# Patient Record
Sex: Male | Born: 1955 | Race: White | Hispanic: No | Marital: Married | Smoking: Former smoker
Health system: Southern US, Community
[De-identification: ages and names within clinical notes are randomized; demographics above are authoritative.]

---

## 2003-07-26 ENCOUNTER — Encounter: Admission: RE | Admit: 2003-07-26 | Discharge: 2003-07-26 | Payer: Self-pay | Admitting: Orthopedic Surgery

## 2005-01-01 ENCOUNTER — Inpatient Hospital Stay (HOSPITAL_COMMUNITY): Admission: RE | Admit: 2005-01-01 | Discharge: 2005-01-04 | Payer: Self-pay | Admitting: Orthopedic Surgery

## 2005-01-23 ENCOUNTER — Inpatient Hospital Stay (HOSPITAL_COMMUNITY): Admission: EM | Admit: 2005-01-23 | Discharge: 2005-01-31 | Payer: Self-pay | Admitting: Emergency Medicine

## 2005-04-22 ENCOUNTER — Encounter: Admission: RE | Admit: 2005-04-22 | Discharge: 2005-04-22 | Payer: Self-pay | Admitting: General Surgery

## 2006-11-11 IMAGING — CT CT ABDOMEN W/ CM
1 of 5 series · 14 of 36 positions shown, 19 images · IV contrast (ORAL OMNI 350 & 100 ML OMNI 300)
Comparison: 01/23/05.

CLINICAL DATA: Contained bowel perforation at the juncture of the distal duodenum and proximal jejunum.  Follow-up performed.
ABDOMEN CT WITH CONTRAST:
TECHNIQUE: Multidetector CT imaging of the abdomen was performed following the standard protocol during bolus administration of intravenous contrast.
Contrast:  100 cc Omnipaque 300 IV as well as oral contrast.
TECHNIQUE: Multidetector CT imaging of the pelvis was performed following the standard protocol during bolus administration of intravenous contrast.

[Series 102: routine abdomen · axial · 0.86mm/px · z∈[-522,-72]mm · 14 of 221 slices shown, 19 images]
[im 12/221  soft-tissue]
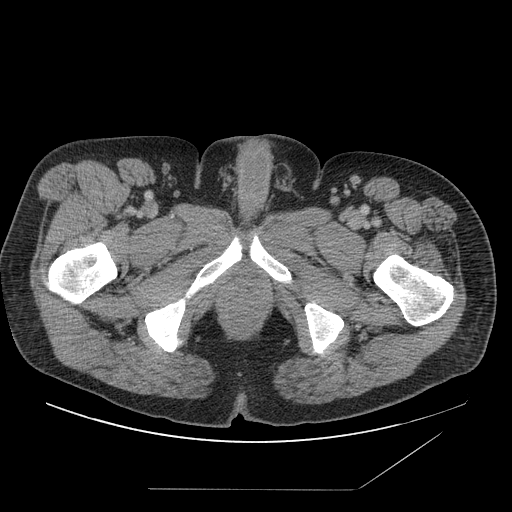
[im 12/221  bone]
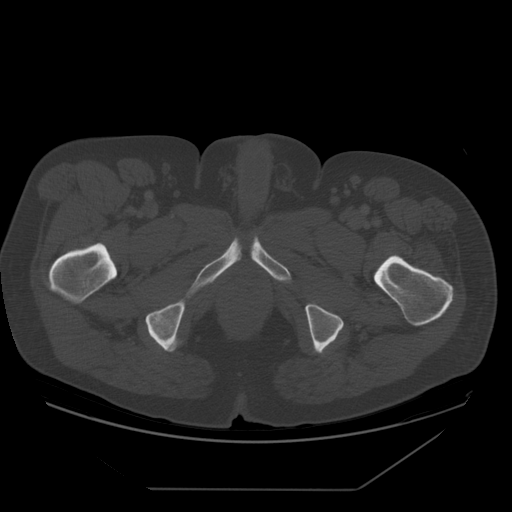
[im 35/221  soft-tissue]
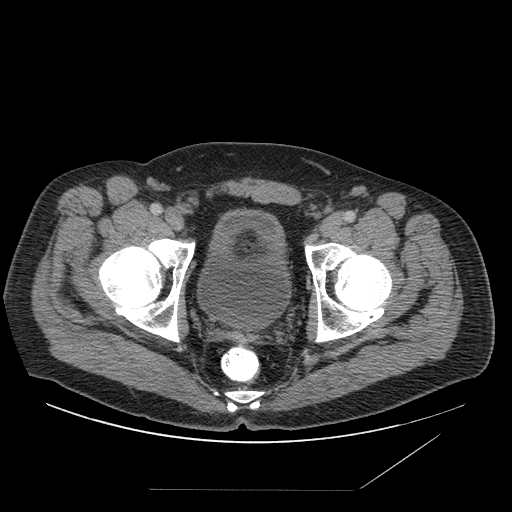
[im 47/221  soft-tissue]
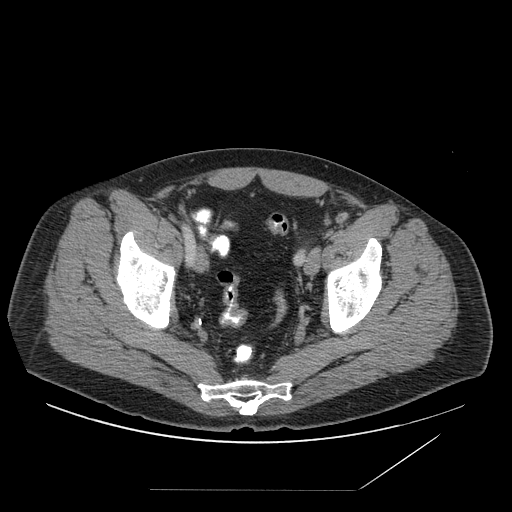
[im 58/221  soft-tissue]
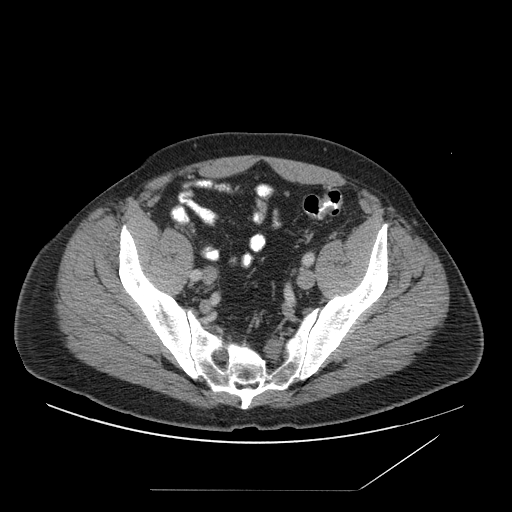
[im 82/221  soft-tissue]
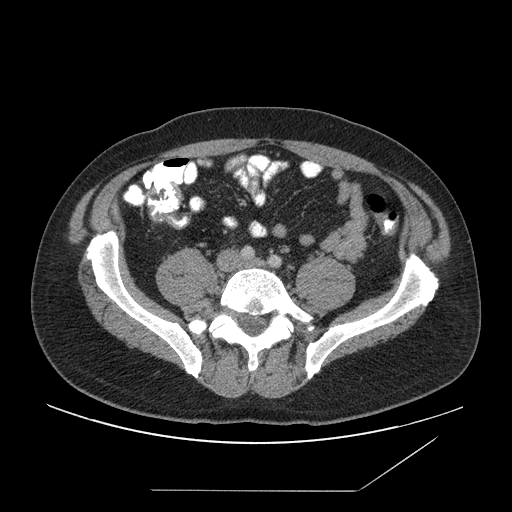
[im 93/221  soft-tissue]
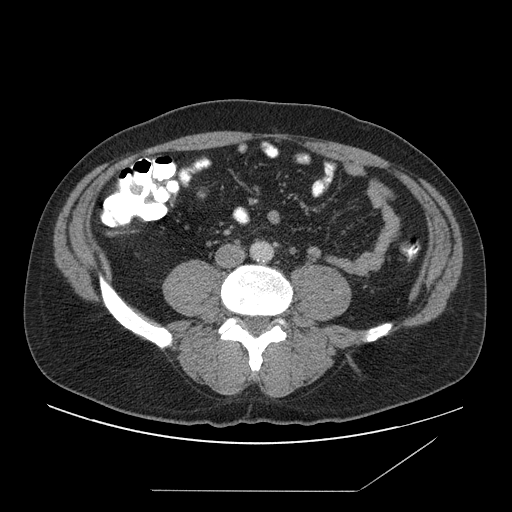
[im 116/221  soft-tissue]
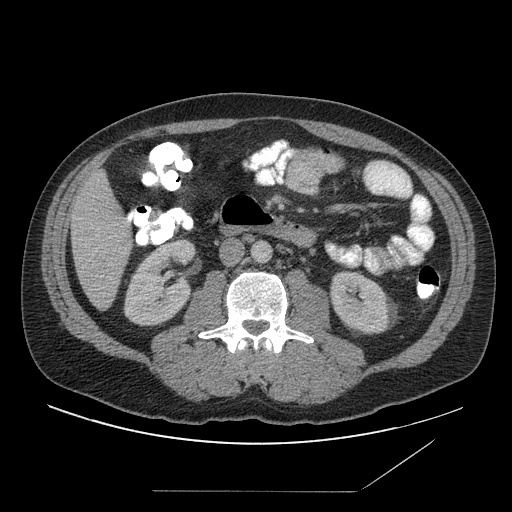
[im 128/221  soft-tissue]
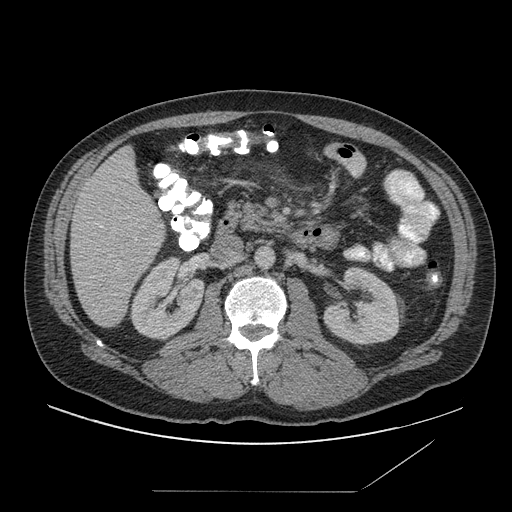
[im 139/221  soft-tissue]
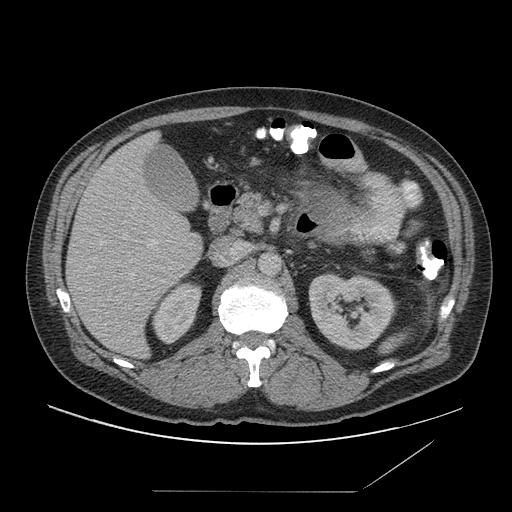
[im 139/221  bone]
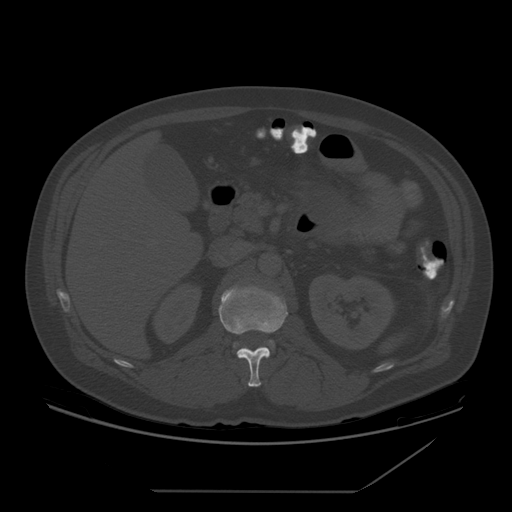
[im 163/221  soft-tissue]
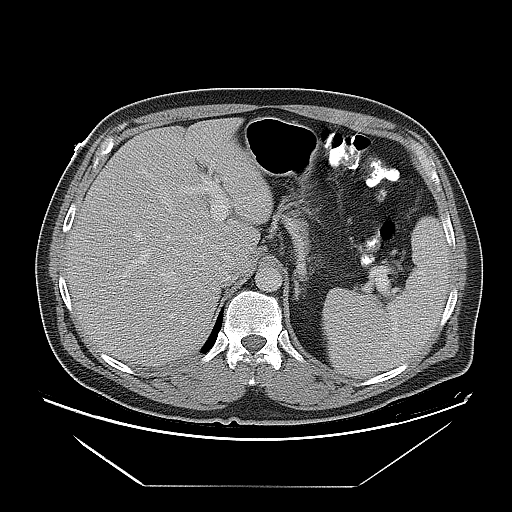
[im 174/221  soft-tissue]
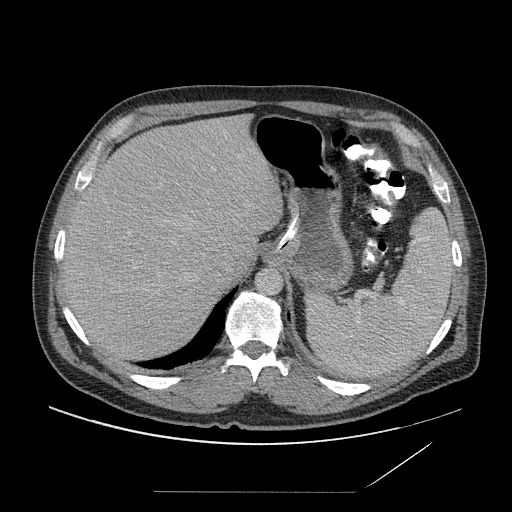
[im 174/221  lung]
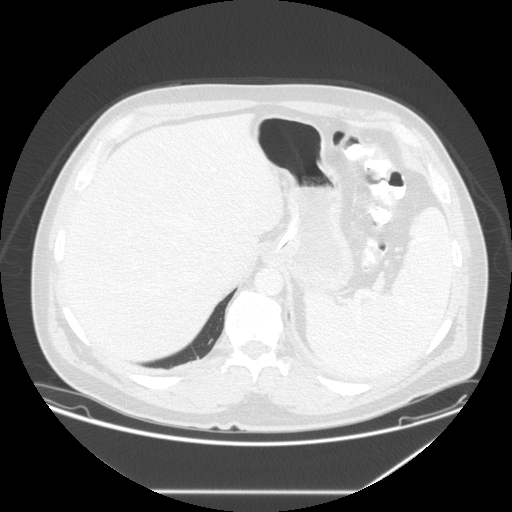
[im 186/221  soft-tissue]
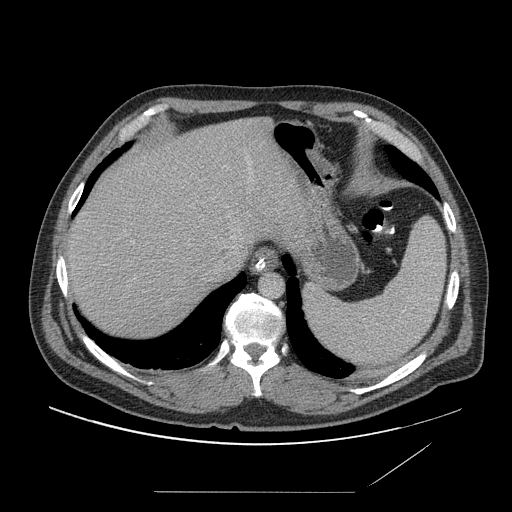
[im 186/221  lung]
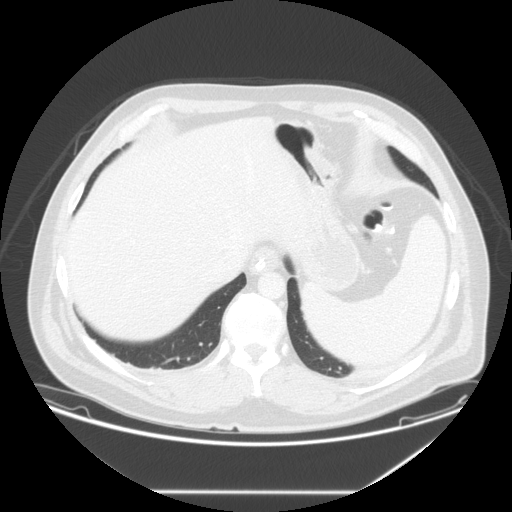
[im 197/221  lung]
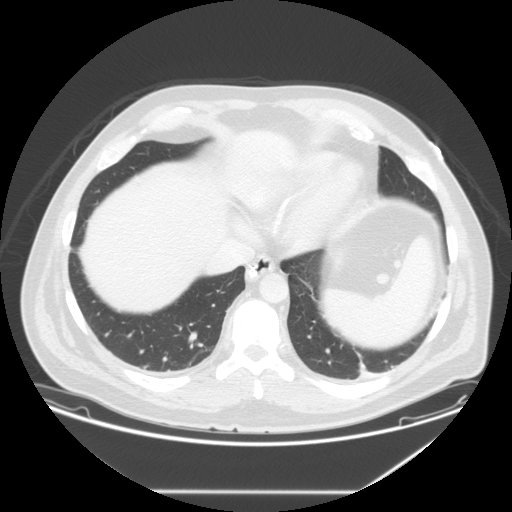
[im 209/221  soft-tissue]
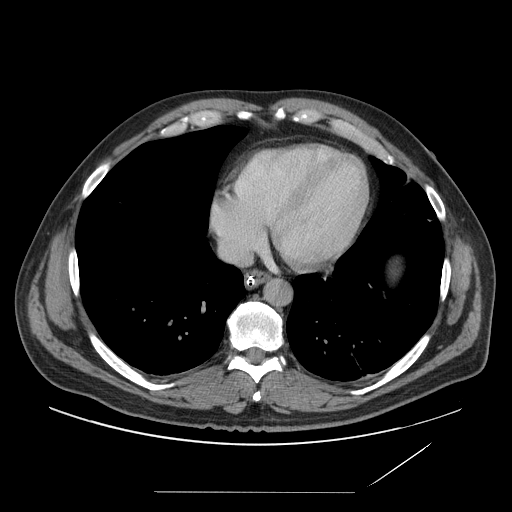
[im 209/221  lung]
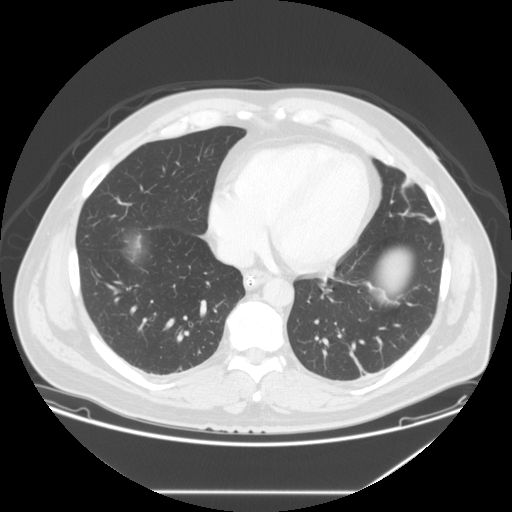

[14 of 36 positions shown; findings below may reference images not displayed]

FINDINGS: At the level of focal bowel perforation located at the ligament of Treitz.  Some contained extraluminal air remains present with surrounding phlegmonous inflammatory change.  This is evolving and has not increased substantially since the prior study.  At this time no focal walled off abscess is identified.  Oral contrast does transit normally into the small bowel and colon without evidence of filling of the extraluminal space at the level of perforation.  No associated bowel obstruction.  No free intraperitoneal air.  Nasogastric tube is present extending into the stomach.
Parenchymal solid organs show normal stable enhancement including the liver, spleen, pancreas, gallbladder, adrenal glands, and kidneys.
IMPRESSION: Evolving contained perforation at the ligament of Treitz with extraluminal air present and surrounding phlegmonous reaction.  No walled off focal abscess is present at this time and there is no evidence of extravasation of oral contrast material into the extraluminal space.  
PELVIS CT WITH CONTRAST:
FINDINGS: Pelvic bowel loops are normal.  No free fluid.  Bladder is unremarkable.
IMPRESSION: Normal CT of the pelvis.

## 2010-03-10 ENCOUNTER — Encounter: Payer: Self-pay | Admitting: Family Medicine

## 2010-11-16 ENCOUNTER — Other Ambulatory Visit: Payer: Self-pay | Admitting: Orthopedic Surgery

## 2010-11-16 DIAGNOSIS — S71009A Unspecified open wound, unspecified hip, initial encounter: Secondary | ICD-10-CM

## 2010-11-16 DIAGNOSIS — R1030 Lower abdominal pain, unspecified: Secondary | ICD-10-CM

## 2010-11-16 DIAGNOSIS — IMO0002 Reserved for concepts with insufficient information to code with codable children: Secondary | ICD-10-CM

## 2010-11-20 ENCOUNTER — Ambulatory Visit
Admission: RE | Admit: 2010-11-20 | Discharge: 2010-11-20 | Disposition: A | Discharge status: Home / Self Care | Payer: BC Managed Care – PPO | Source: Ambulatory Visit | Attending: Orthopedic Surgery | Admitting: Orthopedic Surgery

## 2010-11-20 DIAGNOSIS — IMO0002 Reserved for concepts with insufficient information to code with codable children: Secondary | ICD-10-CM

## 2010-11-20 DIAGNOSIS — S71009A Unspecified open wound, unspecified hip, initial encounter: Secondary | ICD-10-CM

## 2010-11-20 DIAGNOSIS — R1030 Lower abdominal pain, unspecified: Secondary | ICD-10-CM

## 2015-07-21 DIAGNOSIS — Z8249 Family history of ischemic heart disease and other diseases of the circulatory system: Secondary | ICD-10-CM | POA: Diagnosis not present

## 2015-07-21 DIAGNOSIS — Z1211 Encounter for screening for malignant neoplasm of colon: Secondary | ICD-10-CM | POA: Diagnosis not present

## 2015-11-07 DIAGNOSIS — Z23 Encounter for immunization: Secondary | ICD-10-CM | POA: Diagnosis not present

## 2016-04-23 DIAGNOSIS — L821 Other seborrheic keratosis: Secondary | ICD-10-CM | POA: Diagnosis not present

## 2016-04-23 DIAGNOSIS — L814 Other melanin hyperpigmentation: Secondary | ICD-10-CM | POA: Diagnosis not present

## 2016-04-23 DIAGNOSIS — B078 Other viral warts: Secondary | ICD-10-CM | POA: Diagnosis not present

## 2016-04-23 DIAGNOSIS — Z0000 Encounter for general adult medical examination without abnormal findings: Secondary | ICD-10-CM | POA: Diagnosis not present

## 2016-04-23 DIAGNOSIS — D1801 Hemangioma of skin and subcutaneous tissue: Secondary | ICD-10-CM | POA: Diagnosis not present

## 2016-08-12 DIAGNOSIS — Z0000 Encounter for general adult medical examination without abnormal findings: Secondary | ICD-10-CM | POA: Diagnosis not present

## 2016-11-23 ENCOUNTER — Emergency Department (HOSPITAL_COMMUNITY)
Admission: EM | Admit: 2016-11-23 | Discharge: 2016-11-24 | Disposition: A | Discharge status: Home / Self Care | Payer: BLUE CROSS/BLUE SHIELD | Attending: Emergency Medicine | Admitting: Emergency Medicine

## 2016-11-23 ENCOUNTER — Encounter (HOSPITAL_COMMUNITY): Payer: Self-pay | Admitting: *Deleted

## 2016-11-23 DIAGNOSIS — E860 Dehydration: Secondary | ICD-10-CM | POA: Diagnosis not present

## 2016-11-23 DIAGNOSIS — Z79899 Other long term (current) drug therapy: Secondary | ICD-10-CM | POA: Diagnosis not present

## 2016-11-23 DIAGNOSIS — Z87891 Personal history of nicotine dependence: Secondary | ICD-10-CM | POA: Insufficient documentation

## 2016-11-23 DIAGNOSIS — N179 Acute kidney failure, unspecified: Secondary | ICD-10-CM | POA: Insufficient documentation

## 2016-11-23 DIAGNOSIS — Z7982 Long term (current) use of aspirin: Secondary | ICD-10-CM | POA: Diagnosis not present

## 2016-11-23 DIAGNOSIS — R252 Cramp and spasm: Secondary | ICD-10-CM | POA: Diagnosis not present

## 2016-11-23 DIAGNOSIS — R5383 Other fatigue: Secondary | ICD-10-CM | POA: Diagnosis present

## 2016-11-23 LAB — CK: Total CK: 249 U/L (ref 49–397)

## 2016-11-23 LAB — COMPREHENSIVE METABOLIC PANEL
ALBUMIN: 4.5 g/dL (ref 3.5–5.0)
ALK PHOS: 60 U/L (ref 38–126)
ALT: 20 U/L (ref 17–63)
ANION GAP: 13 (ref 5–15)
AST: 30 U/L (ref 15–41)
BUN: 18 mg/dL (ref 6–20)
CO2: 21 mmol/L — AB (ref 22–32)
Calcium: 9.6 mg/dL (ref 8.9–10.3)
Chloride: 102 mmol/L (ref 101–111)
Creatinine, Ser: 2.09 mg/dL — ABNORMAL HIGH (ref 0.61–1.24)
GFR calc Af Amer: 38 mL/min — ABNORMAL LOW (ref >60)
GFR calc non Af Amer: 33 mL/min — ABNORMAL LOW (ref >60)
GLUCOSE: 82 mg/dL (ref 65–99)
Potassium: 3.8 mmol/L (ref 3.5–5.1)
SODIUM: 136 mmol/L (ref 135–145)
Total Bilirubin: 1.7 mg/dL — ABNORMAL HIGH (ref 0.3–1.2)
Total Protein: 7.5 g/dL (ref 6.5–8.1)

## 2016-11-23 LAB — CBC
HCT: 40.6 % (ref 39.0–52.0)
Hemoglobin: 14.1 g/dL (ref 13.0–17.0)
MCH: 29.9 pg (ref 26.0–34.0)
MCHC: 34.7 g/dL (ref 30.0–36.0)
MCV: 86.2 fL (ref 78.0–100.0)
Platelets: 222 10*3/uL (ref 150–400)
RBC: 4.71 MIL/uL (ref 4.22–5.81)
RDW: 12.1 % (ref 11.5–15.5)
WBC: 11.8 10*3/uL — ABNORMAL HIGH (ref 4.0–10.5)

## 2016-11-23 LAB — URINALYSIS, ROUTINE W REFLEX MICROSCOPIC
Bilirubin Urine: NEGATIVE
Glucose, UA: NEGATIVE mg/dL
Hgb urine dipstick: NEGATIVE
Ketones, ur: 5 mg/dL — AB
Leukocytes, UA: NEGATIVE
Nitrite: NEGATIVE
PH: 6 (ref 5.0–8.0)
Protein, ur: 30 mg/dL — AB
Specific Gravity, Urine: 1.015 (ref 1.005–1.030)

## 2016-11-23 LAB — LIPASE, BLOOD: Lipase: 35 U/L (ref 11–51)

## 2016-11-23 MED ORDER — LACTATED RINGERS IV BOLUS (SEPSIS)
1000.0000 mL | Freq: Once | INTRAVENOUS | Status: AC
  Administered 2016-11-23: 1000 mL via INTRAVENOUS

## 2016-11-23 MED ORDER — SODIUM CHLORIDE 0.9 % IV BOLUS (SEPSIS)
1000.0000 mL | Freq: Once | INTRAVENOUS | Status: AC
  Administered 2016-11-23: 1000 mL via INTRAVENOUS

## 2016-11-23 NOTE — ED Provider Notes (Signed)
[facility]-EMERGENCY DEPT Provider Note   CSN: [id number] Arrival date & time: 11/23/16  2022     History   Chief Complaint Chief Complaint  Patient presents with  . Leg Pain  . Abdominal Cramping    HPI TIAN MCMURTREY is a 61 y.o. male.  HPI  Patient is a 61 year old male presenting with muscle cramps and feeling of illness. Patientwas visiting her usual state of health and then went to play in a tennis turner today. Patient says he tried to stay dehydrated with multiple Gatorade, water and "pure" (electrolyte replacement without sugar) patient said by the second match he had muscle cramps and feelings of fatigue. This is atypical for him. Patient usually plays a couple matches of tennis a week and has never had any symptoms similar to this. Patient has no past medical history.  History reviewed. No pertinent past medical history.  There are no active problems to display for this patient.   History reviewed. No pertinent surgical history.     Home Medications    Prior to Admission medications   Medication Sig Start Date End Date Taking? Authorizing Provider  aspirin EC 81 MG tablet Take 81 mg by mouth daily.   Yes [provider]  levothyroxine (SYNTHROID, LEVOTHROID) 137 MCG tablet Take 137 mcg by mouth daily. 10/07/16  Yes [provider]  Multiple Vitamin (MULTIVITAMIN WITH MINERALS) TABS tablet Take 1 tablet by mouth daily.   Yes [provider]    Family History No family history on file.  Social History Social History  Substance Use Topics  . Smoking status: Former Games developer  . Smokeless tobacco: Never Used  . Alcohol use Yes     Allergies   Morphine and related   Review of Systems Review of Systems  Constitutional: Negative for activity change.  Respiratory: Negative for shortness of breath.   Cardiovascular: Negative for chest pain.  Gastrointestinal: Negative for abdominal pain.  Neurological: Positive for weakness.  All  other systems reviewed and are negative.    Physical Exam Updated Vital Signs BP (!) 141/88   Pulse (!) 59   Temp 97.6 F (36.4 C) (Oral)   Resp 14   Ht 6' 1.5" (1.867 m)   Wt 97.5 kg (215 lb)   SpO2 99%   BMI 27.98 kg/m   Physical Exam  Constitutional: He is oriented to person, place, and time. He appears well-nourished.  HENT:  Head: Normocephalic.  Eyes: Conjunctivae are normal.  Cardiovascular: Normal rate and regular rhythm.   Pulmonary/Chest: Effort normal and breath sounds normal.  Abdominal: Soft. He exhibits no distension. There is no tenderness.  Neurological: He is oriented to person, place, and time.  Skin: Skin is warm and dry. He is not diaphoretic.  Psychiatric: He has a normal mood and affect. His behavior is normal.     ED Treatments / Results  Labs (all labs ordered are listed, but only abnormal results are displayed) Labs Reviewed  COMPREHENSIVE METABOLIC PANEL - Abnormal; Notable for the following:       Result Value   CO2 21 (*)    Creatinine, Ser 2.09 (*)    Total Bilirubin 1.7 (*)    GFR calc non Af Amer 33 (*)    GFR calc Af Amer 38 (*)    All other components within normal limits  CBC - Abnormal; Notable for the following:    WBC 11.8 (*)    All other components within normal limits  URINALYSIS, ROUTINE W  REFLEX MICROSCOPIC - Abnormal; Notable for the following:    APPearance HAZY (*)    Ketones, ur 5 (*)    Protein, ur 30 (*)    Bacteria, UA RARE (*)    Squamous Epithelial / LPF 0-5 (*)    All other components within normal limits  LIPASE, BLOOD  CK    EKG  EKG Interpretation None       Radiology No results found.  Procedures Procedures (including critical care time)  Medications Ordered in ED Medications  lactated ringers bolus 1,000 mL (1,000 mLs Intravenous New Bag/Given 11/23/16 2326)  sodium chloride 0.9 % bolus 1,000 mL (0 mLs Intravenous Stopped 11/23/16 2323)  sodium chloride 0.9 % bolus 1,000 mL (1,000 mLs  Intravenous New Bag/Given 11/23/16 2326)     Initial Impression / Assessment and Plan / ED Course  I have reviewed the triage vital signs and the nursing notes.  Pertinent labs & imaging results that were available during my care of the patient were reviewed by me and considered in my medical decision making (see chart for details).     Patient is a 61 year old male presenting with muscle cramps and feeling of illness. Patientwas visiting her usual state of health and then went to play in a tennis turner today. Patient says he tried to stay dehydrated with multiple Gatorade, water and "pure" (electrolyte replacement without sugar) patient said by the second match he had muscle cramps and feelings of fatigue. This is atypical for him. Patient usually plays a couple matches of tennis a week and has never had any symptoms similar to this. Patient has no past medical history.  12:20 AM CK negative. Creatinine is 2. Patient has not had any elevations in creatinine prior that he knows of. I think is likely secondary to exertion and heat. Will give 3 L fluid. If creatinines not worsening plan to discharge home with follow-up with PCP.  Final Clinical Impressions(s) / ED Diagnoses   Final diagnoses:  Dehydration after exertion    New Prescriptions New Prescriptions   No medications on file     Abelino Derrick, MD 11/24/16 1606

## 2016-11-23 NOTE — ED Triage Notes (Signed)
Patient was playing tennis and began experiencing leg cramps, arm cramps and generalized pain.  Patient went to urgent care but then wife called EMS.  According to EMS, patient plays tennis 5 days a week and this has never happened.  EMS placed an 18g L/FA and gave  A 500cc NS bolus.  Patient was transported to ED.

## 2016-11-23 NOTE — ED Triage Notes (Signed)
The pt arrived by [facility] from [facility] he played tennis for 5 hours today and around 1800 he started having total body cramps  He has an iv per ems nss  No previous histgory

## 2016-11-24 LAB — I-STAT CHEM 8, ED
BUN: 17 mg/dL (ref 6–20)
CALCIUM ION: 0.99 mmol/L — AB (ref 1.15–1.40)
Chloride: 107 mmol/L (ref 101–111)
Creatinine, Ser: 1.3 mg/dL — ABNORMAL HIGH (ref 0.61–1.24)
GLUCOSE: 98 mg/dL (ref 65–99)
HCT: 32 % — ABNORMAL LOW (ref 39.0–52.0)
HEMOGLOBIN: 10.9 g/dL — AB (ref 13.0–17.0)
POTASSIUM: 3.8 mmol/L (ref 3.5–5.1)
Sodium: 138 mmol/L (ref 135–145)
TCO2: 21 mmol/L — ABNORMAL LOW (ref 22–32)

## 2016-11-24 MED ORDER — SODIUM CHLORIDE 0.9 % IV BOLUS (SEPSIS)
1000.0000 mL | Freq: Once | INTRAVENOUS | Status: AC
  Administered 2016-11-24: 1000 mL via INTRAVENOUS

## 2016-11-24 NOTE — ED Provider Notes (Signed)
2:06 AM Patient feels much better this time.  Creatinine improved.  Discharged home in good condition.  Ambulatory.  Dehydration.   Azalia Bilis, MD 11/24/16 [phone]

## 2016-11-24 NOTE — Discharge Instructions (Addendum)
Please follow up with your primary care provider.  We think you got very dehydrated secondary to exertion and the heat.

## 2017-04-25 DIAGNOSIS — Z13220 Encounter for screening for lipoid disorders: Secondary | ICD-10-CM | POA: Diagnosis not present

## 2017-04-25 DIAGNOSIS — Z0000 Encounter for general adult medical examination without abnormal findings: Secondary | ICD-10-CM | POA: Diagnosis not present

## 2017-04-25 DIAGNOSIS — Z125 Encounter for screening for malignant neoplasm of prostate: Secondary | ICD-10-CM | POA: Diagnosis not present

## 2017-04-25 DIAGNOSIS — E039 Hypothyroidism, unspecified: Secondary | ICD-10-CM | POA: Diagnosis not present

## 2017-05-09 ENCOUNTER — Ambulatory Visit: Payer: BLUE CROSS/BLUE SHIELD | Admitting: Family Medicine

## 2017-05-09 ENCOUNTER — Ambulatory Visit: Payer: Self-pay

## 2017-05-09 ENCOUNTER — Encounter: Payer: Self-pay | Admitting: Family Medicine

## 2017-05-09 VITALS — BP 149/88 | HR 59 | Ht 74.0 in | Wt 223.0 lb

## 2017-05-09 DIAGNOSIS — E039 Hypothyroidism, unspecified: Secondary | ICD-10-CM | POA: Diagnosis not present

## 2017-05-09 DIAGNOSIS — M25512 Pain in left shoulder: Secondary | ICD-10-CM | POA: Diagnosis not present

## 2017-05-09 MED ORDER — NITROGLYCERIN 0.2 MG/HR TD PT24
MEDICATED_PATCH | TRANSDERMAL | 1 refills | Status: DC
Start: 2017-05-09 — End: 2023-10-27

## 2017-05-09 NOTE — Patient Instructions (Addendum)
Your pain is primarily due to severe subacromial bursitis.  You do have very small partial tears of your infraspinatus (about 5% with no retraction) and supraspinatus (about 10% with 4mm of retraction) as well.  Your biceps tendon has tenosynovitis with a small cyst. I would recommend you work on strengthening with Karin GoldenLorraine to include the iontophoresis and dry needling. Try to avoid painful activities (overhead activities, lifting with extended arm) as much as possible for the next 6 weeks. Nitro patches (these are different than the ionto patches Karin GoldenLorraine will use) 1/4th patch to left shoulder, change daily - people usually take these off when they're doing the ionto patch. Aleve 2 tabs twice a day with food OR ibuprofen 3 tabs three times a day with food for pain and inflammation - typically for 7-10 days then as needed. Can take tylenol in addition to this. I would consider an injection only if you don't improve with the other measures above over 6 weeks just because you have these concurrent tears. Do home exercise program with theraband and scapular stabilization exercises daily 3 sets of 10 once a day. Follow up with me in 6 weeks but call me sooner if you're struggling.

## 2017-05-11 ENCOUNTER — Encounter: Payer: Self-pay | Admitting: Family Medicine

## 2017-05-11 DIAGNOSIS — M25512 Pain in left shoulder: Secondary | ICD-10-CM | POA: Insufficient documentation

## 2017-05-11 DIAGNOSIS — E039 Hypothyroidism, unspecified: Secondary | ICD-10-CM | POA: Insufficient documentation

## 2017-05-11 NOTE — Assessment & Plan Note (Signed)
2/2 severe subacromial bursitis with partial tears of supraspinatus primarily, infraspinatus with very small tear though.  Clinically biceps cyst/tenosynovitis is silent.  He will continue with physical therapy and include iontophoresis, dry needling of surrounding musculature to exclude insertional aspect of supraspinatus/infraspinatus.  I advised against injection at this time given concurrent tears but would consider if not improving with other measures at follow-up in 6 weeks.  Aleve or ibuprofen.  Nitro patches.

## 2017-05-11 NOTE — Progress Notes (Signed)
PCP: Trey SailorsPa, [facility]  Subjective:   HPI: Patient is a 62 y.o. male here for left shoulder pain.  Patient reports for about 3 months he's had lateral left shoulder pain. Worse when playing tennis, throwing ball up in the air (right handed). States this is the third year in a row he's had this problem though last 2 years it resolved with rest. Pain is worse with sleeping, better with arm in extreme abduction. Worse with icing. Last year's problem was helped by PT and dry needling. Has been to PT 3 times this year. Goody powders help. Pain level 2/10 and sharp with reaching out to the side. No skin changes, numbness.  History reviewed. No pertinent past medical history.  Current Outpatient Medications on File Prior to Visit  Medication Sig Dispense Refill  . aspirin EC 81 MG tablet Take 81 mg by mouth daily.    Marland Kitchen. levothyroxine (SYNTHROID, LEVOTHROID) 137 MCG tablet Take 137 mcg by mouth daily.  3  . Multiple Vitamin (MULTIVITAMIN WITH MINERALS) TABS tablet Take 1 tablet by mouth daily.     No current facility-administered medications on file prior to visit.     History reviewed. No pertinent surgical history.  Allergies  Allergen Reactions  . Morphine And Related Other (See Comments)    Patient advised he has nightmares    Social History   Socioeconomic History  . Marital status: Single    Spouse name: Not on file  . Number of children: Not on file  . Years of education: Not on file  . Highest education level: Not on file  Occupational History  . Not on file  Social Needs  . Financial resource strain: Not on file  . Food insecurity:    Worry: Not on file    Inability: Not on file  . Transportation needs:    Medical: Not on file    Non-medical: Not on file  Tobacco Use  . Smoking status: Former Games developermoker  . Smokeless tobacco: Never Used  Substance and Sexual Activity  . Alcohol use: Yes  . Drug use: Never  . Sexual activity: Not on file   Lifestyle  . Physical activity:    Days per week: Not on file    Minutes per session: Not on file  . Stress: Not on file  Relationships  . Social connections:    Talks on phone: Not on file    Gets together: Not on file    Attends religious service: Not on file    Active member of club or organization: Not on file    Attends meetings of clubs or organizations: Not on file    Relationship status: Not on file  . Intimate partner violence:    Fear of current or ex partner: Not on file    Emotionally abused: Not on file    Physically abused: Not on file    Forced sexual activity: Not on file  Other Topics Concern  . Not on file  Social History Narrative  . Not on file    History reviewed. No pertinent family history.  BP (!) 149/88   Pulse (!) 59   Ht 6\' 2"  (1.88 m)   Wt 223 lb (101.2 kg)   BMI 28.63 kg/m   Review of Systems: See HPI above.     Objective:  Physical Exam:  Gen: NAD, comfortable in exam room  Left shoulder: No swelling, ecchymoses.  No gross deformity. No TTP. FROM. Negative Hawkins, + Neers.  Negative Yergasons. Strength 5-/5 with empty can and 4/5 resisted ER.  5/5 IR. Negative apprehension. NV intact distally.  Right shoulder: No swelling, ecchymoses.  No gross deformity. No TTP. FROM. Strength 5/5 with empty can and resisted internal/external rotation. NV intact distally.  MSK u/s left shoulder: Biceps tendon with distal tenosynovitis with a focal cyst measuring x 2.79mmD x 5.31mmW.  Subscapularis normal without tears.  Infraspinatus with 1.64mm area of hyperechogenicity and just proximal to this a very small partial thickness tear without retraction.  AC joint appears normal without arthropathy.  Supraspinatus with insertional tear about 10% width with 4mm of retraction.  Severe subacromial bursitis visualized.  Assessment & Plan:  1. Left shoulder pain - 2/2 severe subacromial bursitis with partial tears of supraspinatus primarily,  infraspinatus with very small tear though.  Clinically biceps cyst/tenosynovitis is silent.  He will continue with physical therapy and include iontophoresis, dry needling of surrounding musculature to exclude insertional aspect of supraspinatus/infraspinatus.  I advised against injection at this time given concurrent tears but would consider if not improving with other measures at follow-up in 6 weeks.  Aleve or ibuprofen.  Nitro patches.

## 2017-06-16 DIAGNOSIS — D126 Benign neoplasm of colon, unspecified: Secondary | ICD-10-CM | POA: Diagnosis not present

## 2017-06-16 DIAGNOSIS — K648 Other hemorrhoids: Secondary | ICD-10-CM | POA: Diagnosis not present

## 2017-06-16 DIAGNOSIS — Z86010 Personal history of colonic polyps: Secondary | ICD-10-CM | POA: Diagnosis not present

## 2017-06-17 DIAGNOSIS — L723 Sebaceous cyst: Secondary | ICD-10-CM | POA: Diagnosis not present

## 2017-06-17 DIAGNOSIS — L989 Disorder of the skin and subcutaneous tissue, unspecified: Secondary | ICD-10-CM | POA: Diagnosis not present

## 2017-06-17 DIAGNOSIS — D485 Neoplasm of uncertain behavior of skin: Secondary | ICD-10-CM | POA: Diagnosis not present

## 2017-06-17 DIAGNOSIS — L821 Other seborrheic keratosis: Secondary | ICD-10-CM | POA: Diagnosis not present

## 2017-06-17 DIAGNOSIS — L820 Inflamed seborrheic keratosis: Secondary | ICD-10-CM | POA: Diagnosis not present

## 2017-06-17 DIAGNOSIS — D225 Melanocytic nevi of trunk: Secondary | ICD-10-CM | POA: Diagnosis not present

## 2017-06-18 DIAGNOSIS — D126 Benign neoplasm of colon, unspecified: Secondary | ICD-10-CM | POA: Diagnosis not present

## 2017-07-03 DIAGNOSIS — D485 Neoplasm of uncertain behavior of skin: Secondary | ICD-10-CM | POA: Diagnosis not present

## 2017-07-03 DIAGNOSIS — L988 Other specified disorders of the skin and subcutaneous tissue: Secondary | ICD-10-CM | POA: Diagnosis not present

## 2017-07-07 ENCOUNTER — Ambulatory Visit (HOSPITAL_BASED_OUTPATIENT_CLINIC_OR_DEPARTMENT_OTHER)
Admission: RE | Admit: 2017-07-07 | Discharge: 2017-07-07 | Disposition: A | Discharge status: Home / Self Care | Payer: BLUE CROSS/BLUE SHIELD | Source: Ambulatory Visit | Attending: Family Medicine | Admitting: Family Medicine

## 2017-07-07 ENCOUNTER — Encounter: Payer: Self-pay | Admitting: Family Medicine

## 2017-07-07 ENCOUNTER — Encounter

## 2017-07-07 ENCOUNTER — Ambulatory Visit: Payer: BLUE CROSS/BLUE SHIELD | Admitting: Family Medicine

## 2017-07-07 VITALS — BP 145/89 | HR 70 | Ht 74.0 in | Wt 224.0 lb

## 2017-07-07 DIAGNOSIS — M25552 Pain in left hip: Secondary | ICD-10-CM

## 2017-07-07 DIAGNOSIS — S79912A Unspecified injury of left hip, initial encounter: Secondary | ICD-10-CM | POA: Diagnosis not present

## 2017-07-07 NOTE — Patient Instructions (Addendum)
You have a partial tear (Grade 2 strain) of your hip external rotators. Ice the area 15 minutes at a time 3-4 times a day. Aleve 2 tabs twice a day with food for pain and inflammation as needed. Consider physical therapy. When tolerated you can start standing hip rotations and hip side raises 3 sets of 10 once a day. Consider nitro patches to improve the blood supply (normally used for tendinitis) though you shouldn't need this to recover. Low resistance cycling, swimming is ok now as long as it doesn't worsen the pain. I suspect it will be 4-6 weeks before you can return to golf and tennis. Follow up with me in 4 weeks or as needed.

## 2017-07-09 ENCOUNTER — Encounter: Payer: Self-pay | Admitting: Family Medicine

## 2017-07-09 DIAGNOSIS — M25552 Pain in left hip: Secondary | ICD-10-CM | POA: Insufficient documentation

## 2017-07-09 NOTE — Progress Notes (Signed)
[facility]P: [name]  Subjective:   HPI: Patient is a 62 y.o. male here for left hip injury.  Patient reports about 2 weeks ago he was playing golf and while swinging his club he felt and heard a sharp crunch and pop on the posterior aspect of his left hip. This improved however he was able to play tennis that night without any pain we did feel some uncomfortable feeling back here at the same spot. Next day he did a 9-hour car ride and felt terrible pain in the posterior aspect of the left hip. Pain seems to be worse with sleeping, lying in bed, walking. Pain level 1 out of 10. + bruising and swelling. No prior injuries. No numbness but has had some twitching locally.  History reviewed. No pertinent past medical history.  Current Outpatient Medications on File Prior to Visit  Medication Sig Dispense Refill  . aspirin EC 81 MG tablet Take 81 mg by mouth daily.    Marland Kitchen levothyroxine (SYNTHROID, LEVOTHROID) 137 MCG tablet Take 137 mcg by mouth daily.  3  . Multiple Vitamin (MULTIVITAMIN WITH MINERALS) TABS tablet Take 1 tablet by mouth daily.    . nitroGLYCERIN (NITRODUR - DOSED IN MG/24 HR) 0.2 mg/hr patch Apply 1/4th patch to affected shoulder, change daily 30 patch 1   No current facility-administered medications on file prior to visit.     History reviewed. No pertinent surgical history.  Allergies  Allergen Reactions  . Morphine And Related Other (See Comments)    Patient advised he has nightmares    Social History   Socioeconomic History  . Marital status: Single    Spouse name: Not on file  . Number of children: Not on file  . Years of education: Not on file  . Highest education level: Not on file  Occupational History  . Not on file  Social Needs  . Financial resource strain: Not on file  . Food insecurity:    Worry: Not on file    Inability: Not on file  . Transportation needs:    Medical: Not on file    Non-medical: Not on file  Tobacco  Use  . Smoking status: Former Games developer  . Smokeless tobacco: Never Used  Substance and Sexual Activity  . Alcohol use: Yes  . Drug use: Never  . Sexual activity: Not on file  Lifestyle  . Physical activity:    Days per week: Not on file    Minutes per session: Not on file  . Stress: Not on file  Relationships  . Social connections:    Talks on phone: Not on file    Gets together: Not on file    Attends religious service: Not on file    Active member of club or organization: Not on file    Attends meetings of clubs or organizations: Not on file    Relationship status: Not on file  . Intimate partner violence:    Fear of current or ex partner: Not on file    Emotionally abused: Not on file    Physically abused: Not on file    Forced sexual activity: Not on file  Other Topics Concern  . Not on file  Social History Narrative  . Not on file    History reviewed. No pertinent family history.  BP (!) 145/89   Pulse 70   Ht  (1.88 m)   Wt 224 lb (101.6 kg)   BMI 28.76 kg/m   Review  of Systems: See HPI above.     Objective:  Physical Exam:  Gen: NAD, comfortable in exam room  Back: No gross deformity, scoliosis. No paraspinal TTP .  No midline or bony TTP. FROM without pain. Strength LEs 5/5 all muscle groups.   2+ MSRs in patellar and achilles tendons, equal bilaterally. Negative SLRs. Sensation intact to light touch bilaterally.  Left hip: No deformity. FROM with 5-/5 strength abduction but no pain.  5/5 all other motions. TTP posterior aspect of greater trochanter NVI distally. Negative logroll Mild pain piriformis, negative fabers and fadirs.   Assessment & Plan:  1. Left hip pain - consistent with partial tear of hip external rotators.  Icing, aleve twice a day as needed.  Shown home exercises to start when tolerated.  Consider nitro.  Cross training.  F/u in 4 weeks.

## 2017-07-09 NOTE — Assessment & Plan Note (Signed)
consistent with partial tear of hip external rotators.  Icing, aleve twice a day as needed.  Shown home exercises to start when tolerated.  Consider nitro.  Cross training.  F/u in 4 weeks.

## 2017-09-10 DIAGNOSIS — S62306A Unspecified fracture of fifth metacarpal bone, right hand, initial encounter for closed fracture: Secondary | ICD-10-CM | POA: Diagnosis not present

## 2017-09-10 DIAGNOSIS — R030 Elevated blood-pressure reading, without diagnosis of hypertension: Secondary | ICD-10-CM | POA: Diagnosis not present

## 2017-09-10 DIAGNOSIS — W228XXA Striking against or struck by other objects, initial encounter: Secondary | ICD-10-CM | POA: Diagnosis not present

## 2017-09-10 DIAGNOSIS — S62300A Unspecified fracture of second metacarpal bone, right hand, initial encounter for closed fracture: Secondary | ICD-10-CM | POA: Diagnosis not present

## 2017-10-30 DIAGNOSIS — S62306A Unspecified fracture of fifth metacarpal bone, right hand, initial encounter for closed fracture: Secondary | ICD-10-CM | POA: Diagnosis not present

## 2017-11-11 DIAGNOSIS — Z23 Encounter for immunization: Secondary | ICD-10-CM | POA: Diagnosis not present

## 2018-02-26 DIAGNOSIS — H40013 Open angle with borderline findings, low risk, bilateral: Secondary | ICD-10-CM | POA: Diagnosis not present

## 2018-03-06 DIAGNOSIS — H40013 Open angle with borderline findings, low risk, bilateral: Secondary | ICD-10-CM | POA: Diagnosis not present

## 2018-05-07 DIAGNOSIS — Z13220 Encounter for screening for lipoid disorders: Secondary | ICD-10-CM | POA: Diagnosis not present

## 2018-05-07 DIAGNOSIS — E039 Hypothyroidism, unspecified: Secondary | ICD-10-CM | POA: Diagnosis not present

## 2018-05-07 DIAGNOSIS — Z0000 Encounter for general adult medical examination without abnormal findings: Secondary | ICD-10-CM | POA: Diagnosis not present

## 2018-06-23 DIAGNOSIS — H40013 Open angle with borderline findings, low risk, bilateral: Secondary | ICD-10-CM | POA: Diagnosis not present

## 2018-06-29 DIAGNOSIS — L821 Other seborrheic keratosis: Secondary | ICD-10-CM | POA: Diagnosis not present

## 2018-06-29 DIAGNOSIS — L570 Actinic keratosis: Secondary | ICD-10-CM | POA: Diagnosis not present

## 2018-06-29 DIAGNOSIS — D2262 Melanocytic nevi of left upper limb, including shoulder: Secondary | ICD-10-CM | POA: Diagnosis not present

## 2018-06-29 DIAGNOSIS — D2261 Melanocytic nevi of right upper limb, including shoulder: Secondary | ICD-10-CM | POA: Diagnosis not present

## 2018-06-29 DIAGNOSIS — D225 Melanocytic nevi of trunk: Secondary | ICD-10-CM | POA: Diagnosis not present

## 2018-08-11 DIAGNOSIS — E039 Hypothyroidism, unspecified: Secondary | ICD-10-CM | POA: Diagnosis not present

## 2018-09-30 DIAGNOSIS — L255 Unspecified contact dermatitis due to plants, except food: Secondary | ICD-10-CM | POA: Diagnosis not present

## 2018-10-20 DIAGNOSIS — H40013 Open angle with borderline findings, low risk, bilateral: Secondary | ICD-10-CM | POA: Diagnosis not present

## 2018-10-30 ENCOUNTER — Other Ambulatory Visit: Payer: Self-pay

## 2018-10-30 ENCOUNTER — Ambulatory Visit: Payer: BC Managed Care – PPO | Admitting: Family Medicine

## 2018-10-30 ENCOUNTER — Encounter: Payer: Self-pay | Admitting: Family Medicine

## 2018-10-30 VITALS — BP 132/90 | Ht 74.0 in | Wt 217.0 lb

## 2018-10-30 DIAGNOSIS — S63591A Other specified sprain of right wrist, initial encounter: Secondary | ICD-10-CM

## 2018-10-30 NOTE — Progress Notes (Signed)
PCP: [facility]  Subjective:   HPI: Patient is a 63 y.o. male here for evaluation of right hand/wrist pain.  The pain started 2 weeks ago.  He denies any specific injury or trauma but notes he plays tennis 4 to 5 days a week and this is what started the pain.  Pain is located on the ulnar side of his hand and wrist.  He does have some radiation of the pain down to his fourth and fifth digits.  Patient also notes occasional triggering of his third finger.  Patient denies any bruising or swelling.  He has some numbness in his fourth and fifth digits on occasion but this is not his main concern.  The pain can get as bad as a 10 out of 10.  It has a sharp stabbing quality to it.   Review of Systems: See HPI above.  History reviewed. No pertinent past medical history.  Current Outpatient Medications on File Prior to Visit  Medication Sig Dispense Refill  . aspirin EC 81 MG tablet Take 81 mg by mouth daily.    . fexofenadine (ALLEGRA) 180 MG tablet TAKE 1 TABLET BY MOUTH DAILY FOR 30 DAYS.    Marland Kitchen levothyroxine (SYNTHROID, LEVOTHROID) 137 MCG tablet Take 137 mcg by mouth daily.  3  . Multiple Vitamin (MULTIVITAMIN WITH MINERALS) TABS tablet Take 1 tablet by mouth daily.    . nitroGLYCERIN (NITRODUR - DOSED IN MG/24 HR) 0.2 mg/hr patch Apply 1/4th patch to affected shoulder, change daily (Patient not taking: Reported on 10/30/2018) 30 patch 1   No current facility-administered medications on file prior to visit.     History reviewed. No pertinent surgical history.  Allergies  Allergen Reactions  . Morphine And Related Other (See Comments)    Patient advised he has nightmares    Social History   Socioeconomic History  . Marital status: Single    Spouse name: Not on file  . Number of children: Not on file  . Years of education: Not on file  . Highest education level: Not on file  Occupational History  . Not on file  Social Needs  . Financial resource strain: Not  on file  . Food insecurity    Worry: Not on file    Inability: Not on file  . Transportation needs    Medical: Not on file    Non-medical: Not on file  Tobacco Use  . Smoking status: Former Research scientist (life sciences)  . Smokeless tobacco: Never Used  Substance and Sexual Activity  . Alcohol use: Yes  . Drug use: Never  . Sexual activity: Not on file  Lifestyle  . Physical activity    Days per week: Not on file    Minutes per session: Not on file  . Stress: Not on file  Relationships  . Social Herbalist on phone: Not on file    Gets together: Not on file    Attends religious service: Not on file    Active member of club or organization: Not on file    Attends meetings of clubs or organizations: Not on file    Relationship status: Not on file  . Intimate partner violence    Fear of current or ex partner: Not on file    Emotionally abused: Not on file    Physically abused: Not on file    Forced sexual activity: Not on file  Other Topics Concern  . Not on file  Social History Narrative  .  Not on file    History reviewed. No pertinent family history.      Objective:  Physical Exam: There were no vitals taken for this visit. Gen: NAD, comfortable in exam room Lungs: Breathing comfortably on room air Hand/wrist Exam right -Inspection: No deformity, no discoloration, no instability. -Palpation: Tenderness palpation over TFCC -ROM: Flexion: 75 degrees; Extension: 70 degrees; Radial deviation: 20 degrees; Ulnar deviation: 25 degrees; Pronation: 70 degrees; Supination: 75 degrees -Strength: Flexion: 5/5; Extension: 5/5; Radial Deviation: 5/5; Ulnar Deviation: 5/5 -Special Tests: Tinels: Negative over carpal tunnel. Positive tinels at cubital tunnel; Phalens: Negative -Limb neurovascularly intact  Contralateral Hand/wrist -Inspection: No deformity, no discoloration, no instability -Palpation: distal radius: non-tender; Distal ulna: non-tender; scaphoid: non-tender -ROM: Flexion:  75 degrees; Extension: 70 degrees; Radial deviation: 20 degrees; Ulnar deviation: 25 degrees; Pronation: 70 degrees; Supination: 75 degrees -Strength: Flexion: 5/5; Extension: 5/5; Radial Deviation: 5/5; Ulnar Deviation: 5/5 -Limb neurovascularly intact   Limited diagnostic ultrasound of right wrist Findings: - Small hypoechoic line going through the TFCC - Normal appearance of ulnar nerve and cubital tunnel -No subluxation of ulnar nerve at cubital tunnel with dynamic examination Impression: - Small TFCC tear Assessment & Plan:  Patient is a 63 y.o. male here for evaluation of right wrist pain  1.  TFCC tear - Patient given wrist brace to wear for the next 4 to 6 weeks -Patient take Tylenol and ibuprofen as needed for pain -Patient to avoid playing tennis until his next visit  Follow-up in 4 to 6 weeks

## 2018-10-30 NOTE — Patient Instructions (Signed)
The pain in your hand and wrist is caused by an injury to the cartilage called the triangular fibro-cartilage complex (TFCC) - You should wear the brace for the next 4 to 6 weeks - You should avoid playing tennis until your next visit -You may take Tylenol and/or ibuprofen as needed for pain  Please follow-up in 4 to 6 weeks so we can reevaluate your wrist

## 2018-11-02 ENCOUNTER — Encounter: Payer: Self-pay | Admitting: Family Medicine

## 2018-11-30 ENCOUNTER — Ambulatory Visit: Payer: BC Managed Care – PPO | Admitting: Family Medicine

## 2018-12-02 ENCOUNTER — Ambulatory Visit: Payer: BC Managed Care – PPO | Admitting: Family Medicine

## 2018-12-02 ENCOUNTER — Other Ambulatory Visit: Payer: Self-pay

## 2018-12-02 VITALS — BP 140/86 | Ht 74.0 in | Wt 222.0 lb

## 2018-12-02 DIAGNOSIS — M7702 Medial epicondylitis, left elbow: Secondary | ICD-10-CM | POA: Diagnosis not present

## 2018-12-02 DIAGNOSIS — S63591D Other specified sprain of right wrist, subsequent encounter: Secondary | ICD-10-CM | POA: Diagnosis not present

## 2018-12-02 NOTE — Patient Instructions (Signed)
The pain in your hand and wrist is caused by an injury to the cartilage called the triangular fibro-cartilage complex (TFCC) Continue the brace to help rest this. Tylenol and/or ibuprofen as needed for pain Icing 15 minutes at a time 3-4 times a day as needed. If this doesn't continue to improve we have two options: physical therapy or an MRI arthrogram. Keep me updated if you don't get better over the next several weeks and which direction you'd like to go if that's the case. Otherwise follow up with me as needed.  You have medial epicondylitis (golfer's elbow) Avoid painful activities as much as possible (unless doing home exercises). Tylenol or ibuprofen as needed for pain. Icing 3-4 times a day - careful around ulnar nerve on inside of elbow. Strengthening with 1 pound weight pronation/supination, wrist flexion, stretching exercises (hold stretches 20-30 seconds and repeat 3 times, exercises 3 sets of 10 of each). Sleeve or counterforce brace may be helpful to unload area of pain while it heals. Consider formal physical therapy, nitro patches, injection if not improving with home exercises.

## 2018-12-03 ENCOUNTER — Encounter: Payer: Self-pay | Admitting: Family Medicine

## 2018-12-03 NOTE — Progress Notes (Signed)
PCP: [facility]  Subjective:   HPI: 9/11: Patient is a 63 y.o. male here for evaluation of right hand/wrist pain.  The pain started 2 weeks ago.  He denies any specific injury or trauma but notes he plays tennis 4 to 5 days a week and this is what started the pain.  Pain is located on the ulnar side of his hand and wrist.  He does have some radiation of the pain down to his fourth and fifth digits.  Patient also notes occasional triggering of his third finger.  Patient denies any bruising or swelling.  He has some numbness in his fourth and fifth digits on occasion but this is not his main concern.  The pain can get as bad as a 10 out of 10.  It has a sharp stabbing quality to it.  10/14: Patient reports he feels about the same compared to last visit. Still with wrist pain with writing, driving. Wearing a brace with mild benefit. Not taking medications for this. Feels ok today. No numbness. Also reports pain left elbow medially that is new, sharp.  Review of Systems: See HPI above.  History reviewed. No pertinent past medical history.  Current Outpatient Medications on File Prior to Visit  Medication Sig Dispense Refill  . aspirin EC 81 MG tablet Take 81 mg by mouth daily.    . fexofenadine (ALLEGRA) 180 MG tablet TAKE 1 TABLET BY MOUTH DAILY FOR 30 DAYS.    Marland Kitchen levothyroxine (SYNTHROID, LEVOTHROID) 137 MCG tablet Take 137 mcg by mouth daily.  3  . Multiple Vitamin (MULTIVITAMIN WITH MINERALS) TABS tablet Take 1 tablet by mouth daily.    . nitroGLYCERIN (NITRODUR - DOSED IN MG/24 HR) 0.2 mg/hr patch Apply 1/4th patch to affected shoulder, change daily (Patient not taking: Reported on 10/30/2018) 30 patch 1   No current facility-administered medications on file prior to visit.     History reviewed. No pertinent surgical history.  Allergies  Allergen Reactions  . Morphine And Related Other (See Comments)    Patient advised he has nightmares    Social  History   Socioeconomic History  . Marital status: Single    Spouse name: Not on file  . Number of children: Not on file  . Years of education: Not on file  . Highest education level: Not on file  Occupational History  . Not on file  Social Needs  . Financial resource strain: Not on file  . Food insecurity    Worry: Not on file    Inability: Not on file  . Transportation needs    Medical: Not on file    Non-medical: Not on file  Tobacco Use  . Smoking status: Former Research scientist (life sciences)  . Smokeless tobacco: Never Used  Substance and Sexual Activity  . Alcohol use: Yes  . Drug use: Never  . Sexual activity: Not on file  Lifestyle  . Physical activity    Days per week: Not on file    Minutes per session: Not on file  . Stress: Not on file  Relationships  . Social Herbalist on phone: Not on file    Gets together: Not on file    Attends religious service: Not on file    Active member of club or organization: Not on file    Attends meetings of clubs or organizations: Not on file    Relationship status: Not on file  . Intimate partner violence    Fear  of current or ex partner: Not on file    Emotionally abused: Not on file    Physically abused: Not on file    Forced sexual activity: Not on file  Other Topics Concern  . Not on file  Social History Narrative  . Not on file    History reviewed. No pertinent family history.      Objective:  Physical Exam: BP 140/86   Ht 6\' 2"  (1.88 m)   Wt 222 lb (100.7 kg)   BMI 28.50 kg/m   Gen: NAD, comfortable in exam room  Right wrist: No deformity, swelling, bruising. FROM with 5/5 strength but pain on ulnar deviation. TTP over TFCC.  No other tenderness. NVI distally. Negative finkelsteins, tinels.  Left elbow: No deformity, instability. FROM with 5/5 strength.  No current pain with wrist flexion, 3rd digit flexion, pronation. TTP medial epicondyle and just distal to this. NVI distally. Collateral ligaments intact  on valgus and varus stress without pain.  Assessment & Plan:  Patient is a 63 y.o. male here for evaluation of right wrist pain  1.  TFCC tear - unfortunately not much improvement since last visit.  Discussed options - he would like to wait on starting physical therapy or doing MRI arthrogram for now but will let 64 know if he wants to proceed with one of these options.  Brace, icing, tylenol and/or ibuprofen.  F/u prn otherwise.  2. Left medial epicondylitis - shown home exercises to do daily.  Counterforce brace.  Icing, tylenol or ibuprofen.  Consider physical therapy.

## 2018-12-29 DIAGNOSIS — M7702 Medial epicondylitis, left elbow: Secondary | ICD-10-CM | POA: Diagnosis not present

## 2018-12-29 DIAGNOSIS — S63591A Other specified sprain of right wrist, initial encounter: Secondary | ICD-10-CM | POA: Diagnosis not present

## 2019-02-23 DIAGNOSIS — E039 Hypothyroidism, unspecified: Secondary | ICD-10-CM | POA: Diagnosis not present

## 2019-05-20 DIAGNOSIS — Z0000 Encounter for general adult medical examination without abnormal findings: Secondary | ICD-10-CM | POA: Diagnosis not present

## 2019-05-20 DIAGNOSIS — E039 Hypothyroidism, unspecified: Secondary | ICD-10-CM | POA: Diagnosis not present

## 2019-05-20 DIAGNOSIS — Z13220 Encounter for screening for lipoid disorders: Secondary | ICD-10-CM | POA: Diagnosis not present

## 2019-05-20 DIAGNOSIS — M25541 Pain in joints of right hand: Secondary | ICD-10-CM | POA: Diagnosis not present

## 2019-05-20 DIAGNOSIS — Z125 Encounter for screening for malignant neoplasm of prostate: Secondary | ICD-10-CM | POA: Diagnosis not present

## 2019-05-20 DIAGNOSIS — Z131 Encounter for screening for diabetes mellitus: Secondary | ICD-10-CM | POA: Diagnosis not present

## 2019-06-10 DIAGNOSIS — H43811 Vitreous degeneration, right eye: Secondary | ICD-10-CM | POA: Diagnosis not present

## 2019-07-01 DIAGNOSIS — D225 Melanocytic nevi of trunk: Secondary | ICD-10-CM | POA: Diagnosis not present

## 2019-07-01 DIAGNOSIS — D2262 Melanocytic nevi of left upper limb, including shoulder: Secondary | ICD-10-CM | POA: Diagnosis not present

## 2019-07-01 DIAGNOSIS — D2261 Melanocytic nevi of right upper limb, including shoulder: Secondary | ICD-10-CM | POA: Diagnosis not present

## 2019-07-01 DIAGNOSIS — L738 Other specified follicular disorders: Secondary | ICD-10-CM | POA: Diagnosis not present

## 2019-07-13 DIAGNOSIS — H43811 Vitreous degeneration, right eye: Secondary | ICD-10-CM | POA: Diagnosis not present

## 2019-07-27 DIAGNOSIS — H40013 Open angle with borderline findings, low risk, bilateral: Secondary | ICD-10-CM | POA: Diagnosis not present

## 2019-08-10 DIAGNOSIS — D485 Neoplasm of uncertain behavior of skin: Secondary | ICD-10-CM | POA: Diagnosis not present

## 2019-08-10 DIAGNOSIS — L110 Acquired keratosis follicularis: Secondary | ICD-10-CM | POA: Diagnosis not present

## 2019-08-31 DIAGNOSIS — M79641 Pain in right hand: Secondary | ICD-10-CM | POA: Diagnosis not present

## 2019-08-31 DIAGNOSIS — M25562 Pain in left knee: Secondary | ICD-10-CM | POA: Diagnosis not present

## 2019-09-07 DIAGNOSIS — M79644 Pain in right finger(s): Secondary | ICD-10-CM | POA: Diagnosis not present

## 2019-09-07 DIAGNOSIS — S63659A Sprain of metacarpophalangeal joint of unspecified finger, initial encounter: Secondary | ICD-10-CM | POA: Diagnosis not present

## 2019-10-27 DIAGNOSIS — H35371 Puckering of macula, right eye: Secondary | ICD-10-CM | POA: Diagnosis not present

## 2019-10-27 DIAGNOSIS — H2513 Age-related nuclear cataract, bilateral: Secondary | ICD-10-CM | POA: Diagnosis not present

## 2019-10-27 DIAGNOSIS — H43813 Vitreous degeneration, bilateral: Secondary | ICD-10-CM | POA: Diagnosis not present

## 2019-12-01 DIAGNOSIS — H35371 Puckering of macula, right eye: Secondary | ICD-10-CM | POA: Diagnosis not present

## 2019-12-01 DIAGNOSIS — H43311 Vitreous membranes and strands, right eye: Secondary | ICD-10-CM | POA: Diagnosis not present

## 2019-12-01 DIAGNOSIS — H43813 Vitreous degeneration, bilateral: Secondary | ICD-10-CM | POA: Diagnosis not present

## 2019-12-21 ENCOUNTER — Encounter (INDEPENDENT_AMBULATORY_CARE_PROVIDER_SITE_OTHER): Payer: Self-pay | Admitting: Ophthalmology

## 2019-12-21 ENCOUNTER — Other Ambulatory Visit: Payer: Self-pay

## 2019-12-21 ENCOUNTER — Ambulatory Visit (INDEPENDENT_AMBULATORY_CARE_PROVIDER_SITE_OTHER): Payer: BC Managed Care – PPO | Admitting: Ophthalmology

## 2019-12-21 DIAGNOSIS — H31093 Other chorioretinal scars, bilateral: Secondary | ICD-10-CM

## 2019-12-21 DIAGNOSIS — H2513 Age-related nuclear cataract, bilateral: Secondary | ICD-10-CM | POA: Diagnosis not present

## 2019-12-21 DIAGNOSIS — H43813 Vitreous degeneration, bilateral: Secondary | ICD-10-CM | POA: Diagnosis not present

## 2019-12-21 DIAGNOSIS — H43311 Vitreous membranes and strands, right eye: Secondary | ICD-10-CM

## 2019-12-21 DIAGNOSIS — H35371 Puckering of macula, right eye: Secondary | ICD-10-CM

## 2019-12-21 NOTE — Assessment & Plan Note (Signed)
Bilateral vitreous detachment yet vitreous floaters are troublesome in the right eye only.

## 2019-12-21 NOTE — Progress Notes (Signed)
12/21/2019     CHIEF COMPLAINT Patient presents for Spots and/or Floaters   HISTORY OF PRESENT ILLNESS: Kenneth Smith is a 64 y.o. male who presents to the clinic today for:   HPI    Spots and/or Floaters    In right eye.  Characterized as small.  Duration of 7 months.  Since onset it is stable.  I, the attending physician,  performed the HPI with the patient and updated documentation appropriately.          Comments    Pt referred by [name] for ERM.  Pt c/o a constant floater in OD. Pt states this started April, 202. Denies any changes in vision. LASIK in 2001       Last edited by [name] on 12/21/2019  9:38 AM. (History)      Referring physician: [name] [address] [place],  [place] [address]  HISTORICAL INFORMATION:   Selected notes from the MEDICAL RECORD NUMBER       CURRENT MEDICATIONS: No current outpatient medications on file. (Ophthalmic Drugs)   No current facility-administered medications for this visit. (Ophthalmic Drugs)   Current Outpatient Medications (Other)  Medication Sig  . aspirin EC 81 MG tablet Take 81 mg by mouth daily.  . fexofenadine (ALLEGRA) 180 MG tablet TAKE 1 TABLET BY MOUTH DAILY FOR 30 DAYS.  Marland Kitchen levothyroxine (SYNTHROID, LEVOTHROID) 137 MCG tablet Take 137 mcg by mouth daily.  . Multiple Vitamin (MULTIVITAMIN WITH MINERALS) TABS tablet Take 1 tablet by mouth daily.  . nitroGLYCERIN (NITRODUR - DOSED IN MG/24 HR) 0.2 mg/hr patch Apply 1/4th patch to affected shoulder, change daily (Patient not taking: Reported on 10/30/2018)   No current facility-administered medications for this visit. (Other)      REVIEW OF SYSTEMS: ROS    Negative for: Constitutional, Gastrointestinal, Neurological, Skin, Genitourinary, Musculoskeletal, HENT, Endocrine, Cardiovascular, Eyes, Respiratory, Psychiatric, Allergic/Imm, Heme/Lymph   Last edited by [name] on 12/21/2019  9:37  AM. (History)       ALLERGIES Allergies  Allergen Reactions  . Morphine And Related Other (See Comments)    Patient advised he has nightmares    PAST MEDICAL HISTORY History reviewed. No pertinent past medical history. History reviewed. No pertinent surgical history.  FAMILY HISTORY History reviewed. No pertinent family history.  SOCIAL HISTORY Social History   Tobacco Use  . Smoking status: Former Games developer  . Smokeless tobacco: Never Used  Substance Use Topics  . Alcohol use: Yes  . Drug use: Never         OPHTHALMIC EXAM:  Base Eye Exam    Visual Acuity (Snellen - Linear)      Right Left   Dist [place] 20/20 20/25       Tonometry (Tonopen, 9:41 AM)      Right Left   Pressure 18 14       Pupils      Pupils Dark Light Shape React APD   Right PERRL 4 3 Round Slow None   Left PERRL 4 3 Round Brisk None       Visual Fields (Counting fingers)      Left Right    Full Full       Neuro/Psych    Oriented x3: Yes   Mood/Affect: Normal       Dilation    Both eyes: 1.0% Mydriacyl, 2.5% Phenylephrine @ 9:41 AM        Slit Lamp and  Fundus Exam    External Exam      Right Left   External Normal Normal       Slit Lamp Exam      Right Left   Lids/Lashes Normal Normal   Conjunctiva/Sclera White and quiet White and quiet   Cornea Clear Clear   Anterior Chamber Deep and quiet Deep and quiet   Iris Round and reactive Round and reactive   Lens 2+ Nuclear sclerosis 2+ Nuclear sclerosis   Anterior Vitreous Normal Normal       Fundus Exam      Right Left   Posterior Vitreous Posterior vitreous detachment, Central vitreous floaters Posterior vitreous detachment   Disc Normal Normal   C/D Ratio 0.05 0.05   Macula Epiretinal membrane, minor, nasal, no topographic distortion Normal   Vessels Normal Normal   Periphery Chorioretinal atrophy, pigmentary change between 5 and 6:00 meridians anteriorly, no retinal holes , 360, 25D and 28D exam Chorioretinal atrophy,  pigmentary change between 5 and 6:00 meridians anteriorly, no retinal holes , 360          IMAGING AND PROCEDURES  Imaging and Procedures for 12/21/19  OCT, Retina - OU - Both Eyes       Right Eye Quality was good. Scan locations included subfoveal. Central Foveal Thickness: 277. Progression has no prior data. Findings include epiretinal membrane.   Left Eye Quality was good. Scan locations included subfoveal. Central Foveal Thickness: 275. Progression has no prior data. Findings include abnormal foveal contour.   Notes Mild epiretinal membrane, nonfoveal distorting, only present on OCT without thickening nasal to the fovea.  Certainly we will observe                ASSESSMENT/PLAN:  Posterior vitreous detachment of both eyes Bilateral vitreous detachment yet vitreous floaters are troublesome in the right eye only.  Vitreous membranes and strands of right eye Symptomatic visual floaters in the right eye.  Currently patient is phakic.  And with symptoms since June 05, 2019.      ICD-10-CM   1. Macular pucker, right eye  H35.371 OCT, Retina - OU - Both Eyes  2. Nuclear sclerotic cataract of both eyes  H25.13   3. 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 Vitreous membranes and strands of right eye  H43.311 OCT, Retina - OU - Both Eyes    1.  I discussed with the patient that no surgical intervention is warranted nor medical 3 therapy is warranted for the minimal distorting epiretinal membrane right eye.  I explained that progression the epiretinal membrane is biologic alone and not related to anything other than age and biologic progression.  No effect from laser vitreal lysis would be expected or seen in the past.  For clarity sake, observe the epiretinal membrane.  2.  I spoken generalities with potential sequence of things that can be done for the symptomatic floaters in the right eye and I  have asked the patient return completely to Dr. Ronnie Doss care for evaluation.  3.  I also explained to the patient that I typically would not offer vitrectomy for symptomatic floaters hampering vision in a phakic patient in this age group.  Often cataract surgery alone will further prompt vitreous syneresis leading to floaters moving out of the way or at least less symptomatic  Ophthalmic Meds Ordered this visit:  No orders of the defined types were placed  in this encounter.  Patient may return as needed or at the discretion of [name]    Return in about 1 year (around 12/20/2020) for DILATE OU, COLOR FP, OCT.  There are no Patient Instructions on file for this visit.   Explained the diagnoses, plan, and follow up with the patient and they expressed understanding.  Patient expressed understanding of the importance of proper follow up care.   Alford Highland Lamar Meter M.D. Diseases & Surgery of the Retina and Vitreous [facility] 12/21/19     Abbreviations: M myopia (nearsighted); A astigmatism; H hyperopia (farsighted); P presbyopia; Mrx spectacle prescription;  CTL contact lenses; OD right eye; OS left eye; OU both eyes  XT exotropia; ET esotropia; PEK punctate epithelial keratitis; PEE punctate epithelial erosions; DES dry eye syndrome; MGD meibomian gland dysfunction; ATs artificial tears; PFAT's preservative free artificial tears; NSC nuclear sclerotic cataract; PSC posterior subcapsular cataract; ERM epi-retinal membrane; PVD posterior vitreous detachment; RD retinal detachment; DM diabetes mellitus; DR diabetic retinopathy; NPDR non-proliferative diabetic retinopathy; PDR proliferative diabetic retinopathy; CSME clinically significant macular edema; DME diabetic macular edema; dbh dot blot hemorrhages; CWS cotton wool spot; POAG primary open angle glaucoma; C/D cup-to-disc ratio; HVF humphrey visual field; GVF goldmann visual field; OCT optical coherence tomography;  IOP intraocular pressure; BRVO Branch retinal vein occlusion; CRVO central retinal vein occlusion; CRAO central retinal artery occlusion; BRAO branch retinal artery occlusion; RT retinal tear; SB scleral buckle; PPV pars plana vitrectomy; VH Vitreous hemorrhage; PRP panretinal laser photocoagulation; IVK intravitreal kenalog; VMT vitreomacular traction; MH Macular hole;  NVD neovascularization of the disc; NVE neovascularization elsewhere; AREDS age related eye disease study; ARMD age related macular degeneration; POAG primary open angle glaucoma; EBMD epithelial/anterior basement membrane dystrophy; ACIOL anterior chamber intraocular lens; IOL intraocular lens; PCIOL posterior chamber intraocular lens; Phaco/IOL phacoemulsification with intraocular lens placement; PRK photorefractive keratectomy; LASIK laser assisted in situ keratomileusis; HTN hypertension; DM diabetes mellitus; COPD chronic obstructive pulmonary disease

## 2019-12-21 NOTE — Assessment & Plan Note (Signed)
Symptomatic visual floaters in the right eye.  Currently patient is phakic.  And with symptoms since June 05, 2019.

## 2019-12-27 DIAGNOSIS — H2513 Age-related nuclear cataract, bilateral: Secondary | ICD-10-CM | POA: Diagnosis not present

## 2019-12-27 DIAGNOSIS — H35371 Puckering of macula, right eye: Secondary | ICD-10-CM | POA: Diagnosis not present

## 2019-12-27 DIAGNOSIS — H43391 Other vitreous opacities, right eye: Secondary | ICD-10-CM | POA: Diagnosis not present

## 2019-12-27 DIAGNOSIS — H43811 Vitreous degeneration, right eye: Secondary | ICD-10-CM | POA: Diagnosis not present

## 2019-12-27 DIAGNOSIS — H43311 Vitreous membranes and strands, right eye: Secondary | ICD-10-CM | POA: Diagnosis not present

## 2019-12-27 DIAGNOSIS — H43813 Vitreous degeneration, bilateral: Secondary | ICD-10-CM | POA: Diagnosis not present

## 2020-03-28 DIAGNOSIS — M25512 Pain in left shoulder: Secondary | ICD-10-CM | POA: Diagnosis not present

## 2020-04-04 DIAGNOSIS — M25512 Pain in left shoulder: Secondary | ICD-10-CM | POA: Diagnosis not present

## 2020-12-21 ENCOUNTER — Encounter (INDEPENDENT_AMBULATORY_CARE_PROVIDER_SITE_OTHER): Payer: Self-pay | Admitting: Ophthalmology

## 2020-12-25 DIAGNOSIS — R972 Elevated prostate specific antigen [PSA]: Secondary | ICD-10-CM | POA: Diagnosis not present

## 2020-12-25 DIAGNOSIS — Z125 Encounter for screening for malignant neoplasm of prostate: Secondary | ICD-10-CM | POA: Diagnosis not present

## 2021-05-17 DIAGNOSIS — S83241A Other tear of medial meniscus, current injury, right knee, initial encounter: Secondary | ICD-10-CM | POA: Diagnosis not present

## 2021-05-17 DIAGNOSIS — M25561 Pain in right knee: Secondary | ICD-10-CM | POA: Diagnosis not present

## 2021-05-22 DIAGNOSIS — M25561 Pain in right knee: Secondary | ICD-10-CM | POA: Diagnosis not present

## 2021-06-19 DIAGNOSIS — M25561 Pain in right knee: Secondary | ICD-10-CM | POA: Diagnosis not present

## 2021-06-28 DIAGNOSIS — R252 Cramp and spasm: Secondary | ICD-10-CM | POA: Diagnosis not present

## 2021-06-28 DIAGNOSIS — Z136 Encounter for screening for cardiovascular disorders: Secondary | ICD-10-CM | POA: Diagnosis not present

## 2021-06-28 DIAGNOSIS — E039 Hypothyroidism, unspecified: Secondary | ICD-10-CM | POA: Diagnosis not present

## 2021-06-28 DIAGNOSIS — R209 Unspecified disturbances of skin sensation: Secondary | ICD-10-CM | POA: Diagnosis not present

## 2021-06-28 DIAGNOSIS — Z125 Encounter for screening for malignant neoplasm of prostate: Secondary | ICD-10-CM | POA: Diagnosis not present

## 2021-06-28 DIAGNOSIS — Z0000 Encounter for general adult medical examination without abnormal findings: Secondary | ICD-10-CM | POA: Diagnosis not present

## 2022-04-04 ENCOUNTER — Ambulatory Visit: Payer: Medicare HMO | Admitting: Podiatry

## 2022-07-08 ENCOUNTER — Other Ambulatory Visit: Payer: Self-pay

## 2022-07-08 ENCOUNTER — Ambulatory Visit (INDEPENDENT_AMBULATORY_CARE_PROVIDER_SITE_OTHER): Payer: Medicare HMO | Admitting: Family Medicine

## 2022-07-08 VITALS — BP 132/84 | Ht 73.0 in | Wt 205.0 lb

## 2022-07-08 DIAGNOSIS — G8929 Other chronic pain: Secondary | ICD-10-CM

## 2022-07-08 DIAGNOSIS — M25511 Pain in right shoulder: Secondary | ICD-10-CM | POA: Diagnosis not present

## 2022-07-08 DIAGNOSIS — S86812A Strain of other muscle(s) and tendon(s) at lower leg level, left leg, initial encounter: Secondary | ICD-10-CM

## 2022-07-08 NOTE — Patient Instructions (Signed)
You have two small tears of your rotator cuff and biceps tendinitis but these should do well with conservative treatment. Aleve 2 tabs twice a day with food OR ibuprofen 3 tabs three times a day with food for pain and inflammation as needed. Can take tylenol in addition to this. Subacromial injection may be beneficial to help with pain and to decrease inflammation if you're struggling. Consider physical therapy with transition to home exercise program. Do home exercise program with theraband and scapular stabilization exercises daily 3 sets of 10 once a day. If not improving at follow-up we will consider injection, physical therapy, and/or nitro patches. Follow up with me in 1 month to 6 weeks.  You have a calf strain Compression sleeve with sports for at least the next 6 weeks. Icing for 15 minutes at a time 3-4 times a day as needed. Heel lifts either in temporary orthotics or on their own to prevent further strain especially when playing tennis. Do home exercises most days of the week.

## 2022-07-09 ENCOUNTER — Encounter: Payer: Self-pay | Admitting: Family Medicine

## 2022-07-09 NOTE — Progress Notes (Signed)
PCP: [name]  Subjective:   HPI: Patient is a 67 y.o. male here for left calf and right shoulder pain.  Patient reports about 3-4 weeks ago he felt a sharp pain in left calf medially when playing tennis. This has happened once since then as well. But still with pain in general medial left calf. Has tried sleeve, stretching with some benefit. + swelling but no bruising. Also with right shoulder pain for over a year, worse with reaching and overhead motions.  History reviewed. No pertinent past medical history.  Current Outpatient Medications on File Prior to Visit  Medication Sig Dispense Refill   aspirin EC 81 MG tablet Take 81 mg by mouth daily.     fexofenadine (ALLEGRA) 180 MG tablet TAKE 1 TABLET BY MOUTH DAILY FOR 30 DAYS.     levothyroxine (SYNTHROID, LEVOTHROID) 137 MCG tablet Take 137 mcg by mouth daily.  3   Multiple Vitamin (MULTIVITAMIN WITH MINERALS) TABS tablet Take 1 tablet by mouth daily.     nitroGLYCERIN (NITRODUR - DOSED IN MG/24 HR) 0.2 mg/hr patch Apply 1/4th patch to affected shoulder, change daily (Patient not taking: Reported on 10/30/2018) 30 patch 1   No current facility-administered medications on file prior to visit.    History reviewed. No pertinent surgical history.  Allergies  Allergen Reactions   Morphine And Codeine Other (See Comments)    Patient advised he has nightmares    BP 132/84   Ht 6\' 1"  (1.854 m)   Wt 205 lb (93 kg)   BMI 27.05 kg/m       No data to display              No data to display              Objective:  Physical Exam:  Gen: NAD, comfortable in exam room  Left lower leg: No deformity, swelling, bruising. FROM with 5/5 strength plantarflexion and dorsiflexion of ankle. No tenderness to palpation. NVI distally.  Right shoulder: No swelling, ecchymoses.  No gross deformity. No TTP. FROM with painful arc. Negative Hawkins, positive Neers. Negative Yergasons. Strength 5/5  with empty can and resisted internal/external rotation. NV intact distally.  Limited MSK u/s left calf: hypoechoic change distal aspect medial gastroc at insertion point on fascia.  Achilles intact.  Otherwise normal.  Complete MSK u/s right shoulder: Biceps tendon: thickened, heterogeneous signal with tenosynovitis Pec major tendon: intact Subscapularis: small partial thickness insertional sided tear AC joint: mild arthropathy with effusion.  No pain with sonopalpation Infraspinatus: intact without abnormalities Supraspinatus: partial thickness interstitial tear at insertion on footplate with 4mm retraction at largest point. Posterior glenohumeral joint: no visible labral tear, paralabral cyst.  Impression:  Partial thickness tears of subscapularis and supraspinatus.  Bicipital tendinopathy and tenosynovitis.   Assessment & Plan:  1. Left calf strain - low grade 2.  Compression sleeve, icing, heel lifts.  Home exercise program reviewed.  2. Right shoulder pain - noted two small rotator cuff tears, biceps tendinopathy/tenosynovitis.  Home exercises reviewed.  Aleve or ibuprofen.  Consider subacromial injection, physical therapy, nitro patches if not improving.  F/u in 1 month to 6 weeks.

## 2022-07-22 DIAGNOSIS — Z79899 Other long term (current) drug therapy: Secondary | ICD-10-CM | POA: Diagnosis not present

## 2022-07-22 DIAGNOSIS — E039 Hypothyroidism, unspecified: Secondary | ICD-10-CM | POA: Diagnosis not present

## 2022-07-22 DIAGNOSIS — Z125 Encounter for screening for malignant neoplasm of prostate: Secondary | ICD-10-CM | POA: Diagnosis not present

## 2022-07-22 DIAGNOSIS — Z0000 Encounter for general adult medical examination without abnormal findings: Secondary | ICD-10-CM | POA: Diagnosis not present

## 2022-08-05 ENCOUNTER — Ambulatory Visit: Payer: Medicare HMO | Admitting: Family Medicine

## 2022-09-16 DIAGNOSIS — N401 Enlarged prostate with lower urinary tract symptoms: Secondary | ICD-10-CM | POA: Diagnosis not present

## 2022-09-16 DIAGNOSIS — R351 Nocturia: Secondary | ICD-10-CM | POA: Diagnosis not present

## 2022-09-16 DIAGNOSIS — R972 Elevated prostate specific antigen [PSA]: Secondary | ICD-10-CM | POA: Diagnosis not present

## 2022-09-16 DIAGNOSIS — N5201 Erectile dysfunction due to arterial insufficiency: Secondary | ICD-10-CM | POA: Diagnosis not present

## 2022-09-30 DIAGNOSIS — K5520 Angiodysplasia of colon without hemorrhage: Secondary | ICD-10-CM | POA: Diagnosis not present

## 2022-09-30 DIAGNOSIS — D123 Benign neoplasm of transverse colon: Secondary | ICD-10-CM | POA: Diagnosis not present

## 2022-09-30 DIAGNOSIS — Z09 Encounter for follow-up examination after completed treatment for conditions other than malignant neoplasm: Secondary | ICD-10-CM | POA: Diagnosis not present

## 2022-09-30 DIAGNOSIS — K5730 Diverticulosis of large intestine without perforation or abscess without bleeding: Secondary | ICD-10-CM | POA: Diagnosis not present

## 2022-09-30 DIAGNOSIS — D120 Benign neoplasm of cecum: Secondary | ICD-10-CM | POA: Diagnosis not present

## 2022-09-30 DIAGNOSIS — Z86010 Personal history of colonic polyps: Secondary | ICD-10-CM | POA: Diagnosis not present

## 2022-09-30 DIAGNOSIS — D122 Benign neoplasm of ascending colon: Secondary | ICD-10-CM | POA: Diagnosis not present

## 2022-10-02 DIAGNOSIS — D123 Benign neoplasm of transverse colon: Secondary | ICD-10-CM | POA: Diagnosis not present

## 2022-10-02 DIAGNOSIS — D122 Benign neoplasm of ascending colon: Secondary | ICD-10-CM | POA: Diagnosis not present

## 2022-10-02 DIAGNOSIS — D120 Benign neoplasm of cecum: Secondary | ICD-10-CM | POA: Diagnosis not present

## 2022-11-08 ENCOUNTER — Ambulatory Visit: Payer: Medicare HMO | Admitting: Podiatry

## 2022-11-08 ENCOUNTER — Ambulatory Visit: Payer: Medicare HMO

## 2022-11-08 DIAGNOSIS — G5753 Tarsal tunnel syndrome, bilateral lower limbs: Secondary | ICD-10-CM

## 2022-11-08 DIAGNOSIS — M792 Neuralgia and neuritis, unspecified: Secondary | ICD-10-CM

## 2022-11-08 DIAGNOSIS — M778 Other enthesopathies, not elsewhere classified: Secondary | ICD-10-CM

## 2022-11-08 MED ORDER — GABAPENTIN 300 MG PO CAPS: 300.0000 mg | ORAL_CAPSULE | Freq: Two times a day (BID) | ORAL | 2 refills | Status: DC

## 2022-11-08 NOTE — Progress Notes (Signed)
Subjective:  Patient ID: Kenneth Smith, male    DOB: 02-Dec-1955,  MRN: [id number]  Chief Complaint  Patient presents with   Plantar Fasciitis    Feels like walking on stones and strap across top of both feet. Feels cold to the patient. Buzzing feeling to entire foot always. Tightness to top of feet. Patient is not diabetic.     Discussed the use of AI scribe software for clinical note transcription with the patient, who gave verbal consent to proceed.  History of Present Illness   Kenneth Smith, a patient with no significant past medical history, presents with general malaise and discomfort in the feet and chest. The discomfort in the chest is described as a deep ache, similar to a strap across the heart, which is constant and not associated with any activity. The discomfort in the feet is described as burning, tingling, and pins and needles sensation, which is constant and not relieved by any position or activity. The patient reports that these symptoms are the first thing he notices upon waking up, even before getting out of bed. The patient also reports similar discomfort in the shoulders. The patient denies any history of diabetes, back problems, back surgeries, chemotherapy, or radiation therapy. The patient recently started using inserts for high arch support in their shoes but has not noticed any significant difference in symptoms.          Objective:    Physical Exam   CARDIOVASCULAR: Pulses strong and normal bilaterally. NEUROLOGICAL: Positive Tinel's sign bilaterally at the tarsal region. Sensation to light touch intact bilaterally, indicating normal protective sensation.     11/08/2022 XR bilateral foot weightbearing AP lateral oblique -Pes cavus foot deformity noted bilaterally with increased Calcaneal inclination angle.  Bullet hole sinus tarsi noted.  Osteoarthritis noted about the midfoot joints including TMT J and midtarsal joints No images are attached to the encounter.          Assessment:   1. Tarsal tunnel syndrome of both lower extremities   2. Neuritis   3. Capsulitis of foot      Plan:  Patient was evaluated and treated and all questions answered.  Assessment and Plan    Tarsal Tunnel Syndrome Chronic burning, tingling, and pins and needles sensation in both feet, worse in the morning. Positive Tinel's sign bilaterally. No history of diabetes, back problems, or chemotherapy. Currently using over-the-counter inserts with high arch support. -Start Gabapentin 300mg  twice daily. Discussed risks and side effects of the gabapentin with the patient -Consider custom orthotics pending patient's decision. -Follow-up in six weeks to assess response to medication and discuss potential need for imaging or surgical intervention if symptoms persist.                 Corinna Gab, DPM [facility] / [facility]                   11/08/2022

## 2022-11-21 DIAGNOSIS — H2513 Age-related nuclear cataract, bilateral: Secondary | ICD-10-CM | POA: Diagnosis not present

## 2022-12-11 DIAGNOSIS — L820 Inflamed seborrheic keratosis: Secondary | ICD-10-CM | POA: Diagnosis not present

## 2022-12-11 DIAGNOSIS — L821 Other seborrheic keratosis: Secondary | ICD-10-CM | POA: Diagnosis not present

## 2022-12-11 DIAGNOSIS — D2262 Melanocytic nevi of left upper limb, including shoulder: Secondary | ICD-10-CM | POA: Diagnosis not present

## 2022-12-11 DIAGNOSIS — B0089 Other herpesviral infection: Secondary | ICD-10-CM | POA: Diagnosis not present

## 2022-12-11 DIAGNOSIS — L905 Scar conditions and fibrosis of skin: Secondary | ICD-10-CM | POA: Diagnosis not present

## 2022-12-11 DIAGNOSIS — L80 Vitiligo: Secondary | ICD-10-CM | POA: Diagnosis not present

## 2022-12-11 DIAGNOSIS — D2261 Melanocytic nevi of right upper limb, including shoulder: Secondary | ICD-10-CM | POA: Diagnosis not present

## 2022-12-11 DIAGNOSIS — D225 Melanocytic nevi of trunk: Secondary | ICD-10-CM | POA: Diagnosis not present

## 2022-12-11 DIAGNOSIS — D692 Other nonthrombocytopenic purpura: Secondary | ICD-10-CM | POA: Diagnosis not present

## 2022-12-27 ENCOUNTER — Ambulatory Visit: Payer: Medicare HMO | Admitting: Podiatry

## 2022-12-27 ENCOUNTER — Encounter: Payer: Self-pay | Admitting: Podiatry

## 2022-12-27 DIAGNOSIS — G5753 Tarsal tunnel syndrome, bilateral lower limbs: Secondary | ICD-10-CM

## 2022-12-27 DIAGNOSIS — M792 Neuralgia and neuritis, unspecified: Secondary | ICD-10-CM

## 2022-12-27 MED ORDER — GABAPENTIN 300 MG PO CAPS: 300.0000 mg | ORAL_CAPSULE | Freq: Three times a day (TID) | ORAL | 3 refills | Status: DC

## 2022-12-27 NOTE — Progress Notes (Signed)
  Subjective:  Patient ID: Kenneth Smith, male    DOB: 1955-06-22,  MRN: [id number]  Chief Complaint  Patient presents with   Routine Post Op    PATIENT STATES THAT THE MEDICATION REALLY HELPS AT NIGHT , HE STATES THEY SORT OF FEEL THE SAME . HE SAYS HE THINKS IT HELPED A LITTLE BIT     67 y.o. male presents with concern for follow-up on bilateral tarsal tunnel syndrome.  Right worse than left at this time.  He says that the gabapentin that was prescribed at last visit seems to be helping especially helping him sleep at night.  He is taking 300 mg twice daily. Denies side effects. Not sure if he wants to have surgery at this point.   History reviewed. No pertinent past medical history.  Allergies  Allergen Reactions   Morphine And Codeine Other (See Comments)    Patient advised he has nightmares    ROS: Negative except as per HPI above  Objective:  General: AAO x3, NAD  Dermatological: With inspection and palpation of the right and left lower extremities there are no open sores, no preulcerative lesions, no rash or signs of infection present. Nails are of normal length thickness and coloration.   Vascular:  Dorsalis Pedis artery and Posterior Tibial artery pedal pulses are 2/4 bilateral.  Capillary fill time < 3 sec to all digits.   Neruologic: Grossly intact via light touch bilateral.  Pain on the plantar aspect of bilateral foot.  Tingling sensation along the plantar aspect of the foot positive Tinel sign bilateral radiates pain to the plantar foot.  Musculoskeletal: Pes cavus foot deformity noted bilateral  Gait: Unassisted, Nonantalgic.   No images are attached to the encounter.   Assessment:   1. Tarsal tunnel syndrome of both lower extremities   2. Neuritis      Plan:  Patient was evaluated and treated and all questions answered.  # Bilateral lower extremity tarsal tunnel syndrome -Improved from prior with gabapentin therapy.  Continue gabapentin will increase  frequency to 300 mg 3 times daily.  New prescription sent to pharmacy. -Will continue conservative therapy at this time. -Did discuss possible MRI evaluation bilateral rear foot to evaluate for tarsal tunnel send tibial nerve impingement. -Also discussed surgical intervention for include tarsal tunnel release right lower extremity first followed by left in the future if needed discussed the postop recovery course of this procedure -Patient wants to think about if he wants to proceed with MRI evaluation with the understanding of the proceed with MRI would be a consideration for surgical intervention. -He will call if he wants to get MRIs completed and I will put the orders in at that time otherwise continue with gabapentin therapy         Corinna Gab, DPM [facility] / [facility]

## 2023-03-27 DIAGNOSIS — M7712 Lateral epicondylitis, left elbow: Secondary | ICD-10-CM | POA: Diagnosis not present

## 2023-03-27 DIAGNOSIS — M65351 Trigger finger, right little finger: Secondary | ICD-10-CM | POA: Diagnosis not present

## 2023-03-31 DIAGNOSIS — R972 Elevated prostate specific antigen [PSA]: Secondary | ICD-10-CM | POA: Diagnosis not present

## 2023-04-02 DIAGNOSIS — D485 Neoplasm of uncertain behavior of skin: Secondary | ICD-10-CM | POA: Diagnosis not present

## 2023-04-02 DIAGNOSIS — L820 Inflamed seborrheic keratosis: Secondary | ICD-10-CM | POA: Diagnosis not present

## 2023-04-07 DIAGNOSIS — R972 Elevated prostate specific antigen [PSA]: Secondary | ICD-10-CM | POA: Diagnosis not present

## 2023-04-07 DIAGNOSIS — R351 Nocturia: Secondary | ICD-10-CM | POA: Diagnosis not present

## 2023-04-07 DIAGNOSIS — R3915 Urgency of urination: Secondary | ICD-10-CM | POA: Diagnosis not present

## 2023-04-07 DIAGNOSIS — N401 Enlarged prostate with lower urinary tract symptoms: Secondary | ICD-10-CM | POA: Diagnosis not present

## 2023-06-06 ENCOUNTER — Other Ambulatory Visit: Payer: Self-pay | Admitting: Podiatry

## 2023-07-25 DIAGNOSIS — D649 Anemia, unspecified: Secondary | ICD-10-CM | POA: Diagnosis not present

## 2023-07-25 DIAGNOSIS — Z79899 Other long term (current) drug therapy: Secondary | ICD-10-CM | POA: Diagnosis not present

## 2023-07-25 DIAGNOSIS — Z0000 Encounter for general adult medical examination without abnormal findings: Secondary | ICD-10-CM | POA: Diagnosis not present

## 2023-07-25 DIAGNOSIS — R200 Anesthesia of skin: Secondary | ICD-10-CM | POA: Diagnosis not present

## 2023-07-25 DIAGNOSIS — E039 Hypothyroidism, unspecified: Secondary | ICD-10-CM | POA: Diagnosis not present

## 2023-08-11 ENCOUNTER — Telehealth: Payer: Self-pay | Admitting: Neurology

## 2023-08-11 NOTE — Telephone Encounter (Signed)
 LVM and sent mychart msg informing pt of need to reschedule 11/24/23 appt - MD out

## 2023-08-12 DIAGNOSIS — E611 Iron deficiency: Secondary | ICD-10-CM | POA: Diagnosis not present

## 2023-08-13 ENCOUNTER — Telehealth: Payer: Self-pay | Admitting: Neurology

## 2023-08-13 NOTE — Telephone Encounter (Signed)
 Request to r/s appointment

## 2023-09-04 DIAGNOSIS — Z136 Encounter for screening for cardiovascular disorders: Secondary | ICD-10-CM | POA: Diagnosis not present

## 2023-09-04 DIAGNOSIS — I714 Abdominal aortic aneurysm, without rupture, unspecified: Secondary | ICD-10-CM | POA: Diagnosis not present

## 2023-09-04 DIAGNOSIS — Z87891 Personal history of nicotine dependence: Secondary | ICD-10-CM | POA: Diagnosis not present

## 2023-09-09 DIAGNOSIS — M65331 Trigger finger, right middle finger: Secondary | ICD-10-CM | POA: Insufficient documentation

## 2023-09-09 DIAGNOSIS — M79641 Pain in right hand: Secondary | ICD-10-CM | POA: Insufficient documentation

## 2023-09-09 DIAGNOSIS — M65351 Trigger finger, right little finger: Secondary | ICD-10-CM | POA: Diagnosis not present

## 2023-09-09 DIAGNOSIS — M65352 Trigger finger, left little finger: Secondary | ICD-10-CM | POA: Diagnosis not present

## 2023-09-09 DIAGNOSIS — M720 Palmar fascial fibromatosis [Dupuytren]: Secondary | ICD-10-CM | POA: Insufficient documentation

## 2023-09-09 DIAGNOSIS — G5603 Carpal tunnel syndrome, bilateral upper limbs: Secondary | ICD-10-CM | POA: Insufficient documentation

## 2023-09-09 DIAGNOSIS — M65332 Trigger finger, left middle finger: Secondary | ICD-10-CM | POA: Diagnosis not present

## 2023-09-22 DIAGNOSIS — M25561 Pain in right knee: Secondary | ICD-10-CM | POA: Insufficient documentation

## 2023-09-23 DIAGNOSIS — M1711 Unilateral primary osteoarthritis, right knee: Secondary | ICD-10-CM | POA: Diagnosis not present

## 2023-10-27 ENCOUNTER — Ambulatory Visit (INDEPENDENT_AMBULATORY_CARE_PROVIDER_SITE_OTHER): Admitting: Family Medicine

## 2023-10-27 VITALS — BP 130/64 | Ht 74.0 in | Wt 203.0 lb

## 2023-10-27 DIAGNOSIS — G8929 Other chronic pain: Secondary | ICD-10-CM

## 2023-10-27 DIAGNOSIS — M25561 Pain in right knee: Secondary | ICD-10-CM | POA: Diagnosis not present

## 2023-10-28 NOTE — Progress Notes (Signed)
 PCP: Regino Slater, MD  Subjective:   HPI: Patient is a 68 y.o. male here for right knee pain.  Patient reports he's had about 5-6 months of right knee pain. History of left total knee replacement back in 2006. For his right knee he went to orthopedics several months ago and told he had a degenerative meniscal tear though treatment is conservative. About 1-2 months ago went to a different ortho group and had x-rays showing arthritis. He does ok when he plays sports. But afterwards notes pain anterior knee. Has been icing, using ibuprofen and goody powders occasionally. Sleeve was not helpful.  No past medical history on file.  Current Outpatient Medications on File Prior to Visit  Medication Sig Dispense Refill   gabapentin  (NEURONTIN ) 300 MG capsule TAKE 1 CAPSULE BY MOUTH 3 TIMES A DAY 90 capsule 3   levothyroxine (SYNTHROID, LEVOTHROID) 137 MCG tablet Take 137 mcg by mouth daily.  3   No current facility-administered medications on file prior to visit.    No past surgical history on file.  Allergies  Allergen Reactions   Iodinated Contrast Media     Other Reaction(s): stomach upset   Morphine And Codeine Other (See Comments)    Patient advised he has nightmares    BP 130/64   Ht 6' 2 (1.88 m)   Wt 203 lb (92.1 kg)   BMI 26.06 kg/m       No data to display              No data to display              Objective:  Physical Exam:  Gen: NAD, comfortable in exam room  Right knee: Mild effusion.  No other gross deformity, ecchymoses. TTP medial and lateral joint lines, post patellar facets. FROM with normal strength. Negative ant/post drawers. Negative valgus/varus testing. Negative lachman.  Negative mcmurrays, apleys.  NV intact distally.   Assessment & Plan:  1. Right knee pain - consistent with arthritis based on his exam today and tenderness in all compartments.  He's had steroid injection in past without benefit.  Discussed conservative  measures (tylenol, topical medications, supplements, nsaids, other injections, PT).  We also discussed replacement and recommended a couple providers to consider for this.  He will call us  with additional questions.

## 2023-11-10 DIAGNOSIS — G5603 Carpal tunnel syndrome, bilateral upper limbs: Secondary | ICD-10-CM | POA: Diagnosis not present

## 2023-11-10 DIAGNOSIS — M65352 Trigger finger, left little finger: Secondary | ICD-10-CM | POA: Diagnosis not present

## 2023-11-10 DIAGNOSIS — M65331 Trigger finger, right middle finger: Secondary | ICD-10-CM | POA: Diagnosis not present

## 2023-11-10 DIAGNOSIS — M65332 Trigger finger, left middle finger: Secondary | ICD-10-CM | POA: Diagnosis not present

## 2023-11-10 DIAGNOSIS — M65351 Trigger finger, right little finger: Secondary | ICD-10-CM | POA: Diagnosis not present

## 2023-11-10 DIAGNOSIS — M720 Palmar fascial fibromatosis [Dupuytren]: Secondary | ICD-10-CM | POA: Diagnosis not present

## 2023-11-13 DIAGNOSIS — Z23 Encounter for immunization: Secondary | ICD-10-CM | POA: Diagnosis not present

## 2023-11-13 DIAGNOSIS — E039 Hypothyroidism, unspecified: Secondary | ICD-10-CM | POA: Diagnosis not present

## 2023-11-13 DIAGNOSIS — Z79899 Other long term (current) drug therapy: Secondary | ICD-10-CM | POA: Diagnosis not present

## 2023-11-13 DIAGNOSIS — R200 Anesthesia of skin: Secondary | ICD-10-CM | POA: Diagnosis not present

## 2023-11-24 ENCOUNTER — Ambulatory Visit: Admitting: Neurology

## 2023-12-01 DIAGNOSIS — N5201 Erectile dysfunction due to arterial insufficiency: Secondary | ICD-10-CM | POA: Diagnosis not present

## 2023-12-01 DIAGNOSIS — R972 Elevated prostate specific antigen [PSA]: Secondary | ICD-10-CM | POA: Diagnosis not present

## 2023-12-16 DIAGNOSIS — M65352 Trigger finger, left little finger: Secondary | ICD-10-CM | POA: Diagnosis not present

## 2023-12-16 DIAGNOSIS — M65332 Trigger finger, left middle finger: Secondary | ICD-10-CM | POA: Diagnosis not present

## 2023-12-16 DIAGNOSIS — M65351 Trigger finger, right little finger: Secondary | ICD-10-CM | POA: Diagnosis not present

## 2023-12-16 DIAGNOSIS — M65331 Trigger finger, right middle finger: Secondary | ICD-10-CM | POA: Diagnosis not present

## 2023-12-18 DIAGNOSIS — M1711 Unilateral primary osteoarthritis, right knee: Secondary | ICD-10-CM | POA: Diagnosis not present

## 2023-12-22 ENCOUNTER — Ambulatory Visit: Admitting: Neurology

## 2023-12-25 DIAGNOSIS — Z8249 Family history of ischemic heart disease and other diseases of the circulatory system: Secondary | ICD-10-CM | POA: Insufficient documentation

## 2023-12-25 DIAGNOSIS — Z860100 Personal history of colon polyps, unspecified: Secondary | ICD-10-CM | POA: Insufficient documentation

## 2023-12-30 ENCOUNTER — Encounter: Payer: Self-pay | Admitting: Neurology

## 2023-12-30 ENCOUNTER — Ambulatory Visit: Admitting: Neurology

## 2023-12-30 VITALS — BP 132/84 | HR 60 | Ht 74.0 in | Wt 205.5 lb

## 2023-12-30 DIAGNOSIS — R202 Paresthesia of skin: Secondary | ICD-10-CM | POA: Diagnosis not present

## 2023-12-30 NOTE — Progress Notes (Signed)
 Chief Complaint  Patient presents with   New Patient (Initial Visit)    Rm 15, alone, New patient referral from [facility] @ Gwynn Michael MD [phone] and LE numbness and tingling, (needs EMG)   ASSESSMENT AND PLAN  Kenneth Smith is a 68 y.o. male  Slow Worsening bilateral feet paresthesia  Brisk reflex, also begins to complaint of upper extremity involvement,  MRI of cervical spine to rule out cervical spondylitic myelopathy, differentiation diagnosis also including peripheral neuropathy  EMG nerve conduction study  Continue gabapentin  300 mg 2-3 tablets a day,   DIAGNOSTIC DATA (LABS, IMAGING, TESTING) - I reviewed patient records, labs, notes, testing and imaging myself where available.   MEDICAL HISTORY:  Kenneth Smith, is a 68 year old male seen in request by his primary care from [facility] Dr. Regino, [place] for evaluation of bilateral feet paresthesia, initial evaluation in December 30, 2023   History is obtained from the patient and review of electronic medical records. I personally reviewed pertinent available imaging films in PACS.   PMHx of  Hypothyroidism Left knee replacement,   Around 10 years ago, sliding symmetric at the bottom of his feet, he felt something on it, numbness, uncomfortable sensation, mild worsening over the past 10 years, more sensitive, feel like strep across his feet, when bearing weight, he felt palpable painful sensation, at night, sheets touch his feet will make him feel uncomfortable  He denies significant low back pain, no significant neck pain, recent few years, also had intermittent bilateral finger paresthesia, worst at the 5th and 4th finger, denies significant hand weakness  He has mild stiff gait, contributed to his knee issues, had a history of left knee replacement, scheduled for right knee replacement in January 2025  He was evaluated by podiatrist, given gabapentin  300 twice a day, that is helpful, he can sleep  better   Laboratory evaluation from [facility] June 2025, LDL 91 normal TSH 1.76, CMP: Creatinine of 0.83 CBC low normal range hemoglobin of 12.2 B12 954    PHYSICAL EXAM:   Vitals:   12/30/23 1031 12/30/23 1036  BP: (!) 160/90 132/84  Pulse: 60   SpO2: 92%   Weight: 205 lb 8 oz (93.2 kg)   Height: 6' 2 (1.88 m)      Body mass index is 26.38 kg/m.  PHYSICAL EXAMNIATION:  Gen: NAD, conversant, well nourised, well groomed                     Cardiovascular: Regular rate rhythm, no peripheral edema, warm, nontender. Eyes: Conjunctivae clear without exudates or hemorrhage Neck: Supple, no carotid bruits. Pulmonary: Clear to auscultation bilaterally   NEUROLOGICAL EXAM:  MENTAL STATUS: Speech/cognition: Awake, alert, oriented to history taking and casual conversation CRANIAL NERVES: CN II: Visual fields are full to confrontation. Pupils are round equal and briskly reactive to light. CN III, IV, VI: extraocular movement are normal. No ptosis. CN V: Facial sensation is intact to light touch CN VII: Face is symmetric with normal eye closure  CN VIII: Hearing is normal to causal conversation. CN IX, X: Phonation is normal. CN XI: Head turning and shoulder shrug are intact  MOTOR: There is no pronator drift of out-stretched arms. Muscle bulk and tone are normal. Muscle strength is normal.  REFLEXES: Reflexes are 2  and symmetric at the biceps, triceps, 3/3 knees, and ankles. Plantar responses are flexor.  SENSORY: Intact to light touch, pinprick and vibratory sensation are intact in fingers and toes.  COORDINATION: There is no trunk or limb dysmetria noted.  GAIT/STANCE: Posture is normal. Gait is stiff, steady  REVIEW OF SYSTEMS:  Full 14 system review of systems performed and notable only for as above All other review of systems were negative.   ALLERGIES: Allergies  Allergen Reactions   Iodinated Contrast Media     Other Reaction(s): stomach upset  Other  Reaction(s): Not available   Morphine     Other Reaction(s): Not available  morphine   Morphine And Codeine Other (See Comments)    Patient advised he has nightmares   Morphine Sulfate     Other Reaction(s): nightmares    HOME MEDICATIONS: Current Outpatient Medications  Medication Sig Dispense Refill   Cyanocobalamin  (VITAMIN B-12 CR PO) Take 1 tablet by mouth daily.     tadalafil (CIALIS) 5 MG tablet Take 5 mg by mouth daily as needed.     gabapentin  (NEURONTIN ) 300 MG capsule TAKE 1 CAPSULE BY MOUTH 3 TIMES A DAY 90 capsule 3   levothyroxine (SYNTHROID, LEVOTHROID) 137 MCG tablet Take 137 mcg by mouth daily.  3   No current facility-administered medications for this visit.    PAST MEDICAL HISTORY: History reviewed. No pertinent past medical history.  PAST SURGICAL HISTORY: History reviewed. No pertinent surgical history.  FAMILY HISTORY: Family History  Problem Relation Age of Onset   Parkinsonism Father     SOCIAL HISTORY: Social History   Socioeconomic History   Marital status: Married    Spouse name: sue   Number of children: 2   Years of education: Not on file   Highest education level: Bachelor's degree (e.g., BA, AB, BS)  Occupational History   Not on file  Tobacco Use   Smoking status: Former   Smokeless tobacco: Never  Vaping Use   Vaping status: Never Used  Substance and Sexual Activity   Alcohol use: Yes    Alcohol/week: 10.0 standard drinks of alcohol    Types: 10 Standard drinks or equivalent per week   Drug use: Never   Sexual activity: Yes    Birth control/protection: None  Other Topics Concern   Not on file  Social History Narrative   Not on file   Social Drivers of Health   Financial Resource Strain: Not on file  Food Insecurity: Not on file  Transportation Needs: Not on file  Physical Activity: Not on file  Stress: Not on file  Social Connections: Not on file  Intimate Partner Violence: Not on file      Modena Callander, M.D.  Ph.D.  [facility] [address], [address] [place], [place] [address] Ph: [phone] Fax: [phone]  CC:  Regino Slater, MD [address] [place],  [place] [address]  Regino Slater, MD

## 2024-01-01 ENCOUNTER — Telehealth: Payer: Self-pay | Admitting: Neurology

## 2024-01-01 DIAGNOSIS — Z01818 Encounter for other preprocedural examination: Secondary | ICD-10-CM | POA: Diagnosis not present

## 2024-01-01 DIAGNOSIS — Z23 Encounter for immunization: Secondary | ICD-10-CM | POA: Diagnosis not present

## 2024-01-01 DIAGNOSIS — E039 Hypothyroidism, unspecified: Secondary | ICD-10-CM | POA: Diagnosis not present

## 2024-01-01 DIAGNOSIS — R200 Anesthesia of skin: Secondary | ICD-10-CM | POA: Diagnosis not present

## 2024-01-01 NOTE — Telephone Encounter (Signed)
MRI order sent to [place] [phone]

## 2024-01-02 ENCOUNTER — Encounter: Payer: Self-pay | Admitting: Neurology

## 2024-01-06 ENCOUNTER — Ambulatory Visit
Admission: RE | Admit: 2024-01-06 | Discharge: 2024-01-06 | Disposition: A | Discharge status: Home / Self Care | Source: Ambulatory Visit | Attending: Neurology | Admitting: Neurology

## 2024-01-06 ENCOUNTER — Ambulatory Visit: Payer: Self-pay | Admitting: Neurology

## 2024-01-06 DIAGNOSIS — R202 Paresthesia of skin: Secondary | ICD-10-CM

## 2024-01-08 DIAGNOSIS — M720 Palmar fascial fibromatosis [Dupuytren]: Secondary | ICD-10-CM | POA: Diagnosis not present

## 2024-01-17 ENCOUNTER — Other Ambulatory Visit: Payer: Self-pay | Admitting: Podiatry

## 2024-01-30 DIAGNOSIS — M25642 Stiffness of left hand, not elsewhere classified: Secondary | ICD-10-CM | POA: Diagnosis not present

## 2024-02-09 ENCOUNTER — Telehealth: Payer: Self-pay | Admitting: Neurology

## 2024-02-09 NOTE — Telephone Encounter (Signed)
 Pt is asking if his upcoming R knee replacement on 01-09 will prevent him from having his upcoming NCV, please call and advise.

## 2024-02-10 NOTE — Telephone Encounter (Signed)
 Patient returned phone call; transferred call to [facility].

## 2024-02-10 NOTE — Telephone Encounter (Signed)
 Dr.Yan please see the original note below. Pt scheduled for NCS on 03/17/24, knee replaced scheduled on 02/27/24.  I told pt I would confirm with your when you returned. Okay to keep scheduled NCS?   Please advise

## 2024-02-10 NOTE — Telephone Encounter (Signed)
 Left message for patient to call.   Per recent office visit note on 12/30/23:     It appear Dr. Onita is aware of right knee placement and still ordered Nerve Conduction test.   I was going to relay this information okay to proceed.

## 2024-02-10 NOTE — Telephone Encounter (Signed)
 Left  message for patient to call2nd attempt left message for pt 02-02-2024

## 2024-02-10 NOTE — Telephone Encounter (Signed)
 Pt returned call. Please call back when available.

## 2024-02-16 NOTE — Telephone Encounter (Signed)
 Will reschedule his NCS, please let patient call back after he recovered from his knee replacement

## 2024-02-17 NOTE — Telephone Encounter (Signed)
 Patient informed, NCS has been canceled pt will call back to schedule post surgery.

## 2024-03-10 ENCOUNTER — Ambulatory Visit (INDEPENDENT_AMBULATORY_CARE_PROVIDER_SITE_OTHER): Admitting: Neurology

## 2024-03-10 VITALS — BP 150/93

## 2024-03-10 DIAGNOSIS — G5603 Carpal tunnel syndrome, bilateral upper limbs: Secondary | ICD-10-CM | POA: Diagnosis not present

## 2024-03-10 DIAGNOSIS — R202 Paresthesia of skin: Secondary | ICD-10-CM

## 2024-03-10 DIAGNOSIS — G5622 Lesion of ulnar nerve, left upper limb: Secondary | ICD-10-CM | POA: Diagnosis not present

## 2024-03-10 NOTE — Progress Notes (Signed)
 "  Chief Complaint  Patient presents with   Follow-up    Pt in EMG room 4. Alone. Here for NCS.    ASSESSMENT AND PLAN  Kenneth Smith is a 69 y.o. 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   MEDICAL HISTORY:  Kenneth Smith, is a 68 year old male seen in request by his primary care from [facility] Dr. Regino, [place] for evaluation of bilateral feet paresthesia, initial evaluation in December 30, 2023   History is obtained from the patient and review of electronic medical records. I personally reviewed pertinent available imaging films in PACS.   PMHx of  Hypothyroidism Left knee replacement,   Around 10 years ago, symmetric at the bottom of his feet, he felt something on it, numbness, uncomfortable sensation, mild worsening over the past 10 years, more sensitive, feel like straps across his feet when bearing weight, painful sensation, at night, sheets touch his feet will make him feel uncomfortable  He denies significant low back pain, no significant neck pain, recent few years, also had intermittent bilateral finger paresthesia, worst at the 5th and 4th finger, denies significant hand weakness  He has mild stiff gait, contributed  to his knee issues, had a history of left knee replacement, scheduled for right knee replacement in January 2025  He was evaluated by podiatrist, given gabapentin  300 twice a day, that is helpful, he can sleep better   Laboratory evaluation from [facility] June 2025, LDL 91 normal TSH 1.76, CMP: Creatinine of 0.83 CBC low normal range hemoglobin of 12.2 B12 954  UPDATE Mar 10 2024: He return for electrodiagnostic study today, showed no evidence of large fiber peripheral neuropathy, mild bilateral carpal tunnel syndromes, 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 We personally reviewed MRI of cervical spine, mild multilevel degenerative changes, no significant canal stenosis, C5-6 showed moderate right foraminal narrowing  He remains active, play tennis regularly, had a history of left knee replacement, candidate for right knee replacement,   complaints frequent bilateral feet plantar surface numbness, uncomfortable sensation  PHYSICAL EXAM:   Vitals:   03/10/24 0825  BP: (!) 150/93    PHYSICAL EXAMNIATION:  Gen: NAD, conversant, well nourised, well groomed                     Cardiovascular: Regular rate rhythm, no peripheral edema, warm, nontender. Eyes: Conjunctivae clear without exudates or hemorrhage Neck: Supple, no carotid bruits. Pulmonary: Clear to auscultation bilaterally   NEUROLOGICAL EXAM:  MENTAL STATUS: Speech/cognition: Awake, alert, oriented to history taking and casual conversation CRANIAL NERVES: CN II: Visual fields are full to confrontation. Pupils are round equal and briskly reactive to light. CN III, IV, VI: extraocular movement are normal. No ptosis. CN V: Facial sensation is intact to light touch CN VII: Face is symmetric with normal  eye closure  CN VIII: Hearing is normal to causal conversation. CN IX, X: Phonation is normal. CN XI: Head turning and shoulder shrug are intact  MOTOR: There is no pronator drift of out-stretched arms. Muscle bulk and tone  are normal. Muscle strength is normal.  REFLEXES: Reflexes are 2  and symmetric at the biceps, triceps,  knees, and trace at ankles. Plantar responses are flexor.  SENSORY: Intact to light touch, pinprick and vibratory sensation are intact in fingers and toes.  COORDINATION: There is no trunk or limb dysmetria noted.  GAIT/STANCE: Posture is normal. Gait is stiff, steady  REVIEW OF SYSTEMS:  Full 14 system review of systems performed and notable only for as above All other review of systems were negative.   ALLERGIES: Allergies  Allergen Reactions   Iodinated Contrast Media     Other Reaction(s): stomach upset  Other Reaction(s): Not available   Morphine     Other Reaction(s): Not available  morphine   Morphine And Codeine Other (See Comments)    Patient advised he has nightmares   Morphine Sulfate     Other Reaction(s): nightmares    HOME MEDICATIONS: Current Outpatient Medications  Medication Sig Dispense Refill   Cyanocobalamin  (VITAMIN B-12 CR PO) Take 1 tablet by mouth daily.     levothyroxine (SYNTHROID, LEVOTHROID) 137 MCG tablet Take 137 mcg by mouth daily.  3   tadalafil (CIALIS) 5 MG tablet Take 5 mg by mouth daily as needed.     No current facility-administered medications for this visit.    PAST MEDICAL HISTORY: No past medical history on file.  PAST SURGICAL HISTORY: No past surgical history on file.  FAMILY HISTORY: Family History  Problem Relation Age of Onset   Parkinsonism Father     SOCIAL HISTORY: Social History   Socioeconomic History   Marital status: Married    Spouse name: sue   Number of children: 2   Years of education: Not on file   Highest education level: Bachelor's degree (e.g., BA, AB, BS)  Occupational History   Not on file  Tobacco Use   Smoking status: Former   Smokeless tobacco: Never  Vaping Use   Vaping status: Never Used  Substance and Sexual Activity   Alcohol use: Yes    Alcohol/week: 10.0 standard  drinks of alcohol    Types: 10 Standard drinks or equivalent per week   Drug use: Never   Sexual activity: Yes    Birth control/protection: None  Other Topics Concern   Not on file  Social History Narrative   Not on file   Social Drivers of Health   Tobacco Use: Medium Risk (12/30/2023)   Patient History    Smoking Tobacco Use: Former    Smokeless Tobacco Use: Never    Passive Exposure: Not on Actuary Strain: Not on file  Food Insecurity: Not on file  Transportation Needs: Not on file  Physical Activity: Not on file  Stress: Not on file  Social Connections: Not on file  Intimate Partner Violence: Not on file  Depression (EYV7-0): Not on file  Alcohol Screen: Not on file  Housing: Not on file  Utilities: Not on file  Health Literacy: Not on file      Modena Callander, M.D. Ph.D.  [facility] [address] [place], [place] [address] Ph: [phone] Fax: [phone]  CC:  Regino Slater, MD [address] [place],  [place] [address]  Regino Slater, MD   "

## 2024-03-10 NOTE — Procedures (Signed)
 "         Full Name: Kenneth Smith Gender: Male MRN #: [id number] Date of Birth: 1955/07/28    Visit Date: 03/10/2024 08:45 Age: 69 Years Examining Physician: Onita Duos Referring Physician: Onita Duos Height: 6 feet 2 inch History: 69 year old male presenting with intermittent bilateral lower extremity paresthesia and fingertips paresthesia  Summary of the test:  Nerve conduction study: Right sural, superficial peroneal sensory responses were normal.  Bilateral radial sensory responses were within normal limit  Bilateral median sensory response showed moderately prolonged peak latency with mildly decreased snap amplitude. Left ulnar sensory response was absent.  Right ulnar sensory response showed slightly increased peak latency within normal range snap amplitude  Right peroneal to EDB, tibial motor responses were normal.  Right ulnar, median motor responses were normal.  Left ulnar motor response showed 26 m/s velocity drop across the elbow, with no significant CMAP amplitude drop.  Electromyography: Selected needle examinations of right lower extremity muscle, lumbosacral paraspinal; upper extremity muscle and cervical paraspinal muscle performed.  There was no significant abnormality noted.  Conclusion: This is an abnormal study.  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  There was no evidence of large fiber peripheral neuropathy    ------------------------------- Duos Onita. M.D. Ph.D.   [facility] [address] [place], [place] [address] Tel: [phone] Fax: [phone]  Verbal informed consent was obtained from the patient, patient was informed of potential risk of procedure, including bruising, bleeding, hematoma formation, infection, muscle  weakness, muscle pain, numbness, among others.        MNC    Nerve / Sites Muscle Latency Ref. Amplitude Ref. Rel Amp Segments Distance Velocity Ref. Area    ms ms mV mV %  cm m/s m/s mVms  R Median - APB     Wrist APB 4.4 <=4.4 8.0 >=4.0 100 Wrist - APB 7   31.0     Upper arm APB 9.0  8.0  100 Upper arm - Wrist 23 50 >=49 31.2  L Median - APB     Wrist APB 4.1 <=4.4 8.1 >=4.0 100 Wrist - APB 7   37.9     Upper arm APB 8.7  8.0  98.6 Upper arm - Wrist 23 50 >=49 37.4  R Ulnar - ADM     Wrist ADM 3.1 <=3.3 7.3 >=6.0 100 Wrist - ADM 7   31.7     B.Elbow ADM 5.7  7.1  98.4 B.Elbow - Wrist 14 56 >=49 30.3     A.Elbow ADM 9.1  6.8  95.3 A.Elbow - B.Elbow 17 50 >=49 30.3  L Ulnar - ADM     Wrist ADM 2.9 <=3.3 7.6 >=6.0 100 Wrist - ADM 7   32.8     B.Elbow ADM 5.7  6.8  89.8 B.Elbow - Wrist 16 57 >=49 30.7     A.Elbow ADM 10.3  6.7  98.4 A.Elbow - B.Elbow 13 29 >=49 31.3  R Peroneal - EDB     Ankle EDB 5.5 <=6.5 3.7 >=2.0 100 Ankle - EDB 9   17.4     Fib head EDB 13.7  3.3  89.3 Fib head - Ankle 34 42 >=44 16.7     Pop fossa EDB 15.9  3.3  101 Pop fossa - Fib head 10 46 >=44 17.5  Pop fossa - Ankle      R Tibial - AH     Ankle AH 5.0 <=5.8 6.0 >=4.0 100 Ankle - AH 9   20.6     Pop fossa AH 17.4  4.9  81.6 Pop fossa - Ankle 51 41 >=41 19.1                 SNC    Nerve / Sites Rec. Site Peak Lat Ref.  Amp Ref. Segments Distance    ms ms V V  cm  R Radial - Anatomical snuff box (Forearm)     Forearm Wrist 2.6 <=2.9 14 >=15 Forearm - Wrist 10  L Radial - Anatomical snuff box (Forearm)     Forearm Wrist 2.4 <=2.9 37 >=15 Forearm - Wrist 10  R Sural - Ankle (Calf)     Calf Ankle 4.4 <=4.4 6 >=6 Calf - Ankle 14  R Superficial peroneal - Ankle     Lat leg Ankle 4.2 <=4.4 6 >=6 Lat leg - Ankle 14  R Median - Orthodromic (Dig II, Mid palm)     Dig II Wrist 4.3 <=3.4 7 >=10 Dig II - Wrist 13  L Median - Orthodromic (Dig II, Mid palm)     Dig II Wrist 4.1 <=3.4 7 >=10 Dig II -  Wrist 13  R Ulnar - Orthodromic, (Dig V, Mid palm)     Dig V Wrist 3.3 <=3.1 6 >=5 Dig V - Wrist 11  L Ulnar - Orthodromic, (Dig V, Mid palm)     Dig V Wrist NR <=3.1 NR >=5 Dig V - Wrist 81                     F  Wave    Nerve F Lat Ref.   ms ms  R Ulnar - ADM 31.6 <=32.0  L Ulnar - ADM 34.4 <=32.0  R Tibial - AH 64.4 <=56.0           EMG Summary Table    Spontaneous MUAP Recruitment  Muscle IA Fib PSW Fasc Other Amp Dur. Poly Pattern  R. Tibialis anterior Normal None None None _______ Normal Normal Normal Normal  R. Tibialis posterior Normal None None None _______ Normal Normal Normal Normal  R. Peroneus longus Normal None None None _______ Normal Normal Normal Normal  R. Gastrocnemius (Medial head) Normal None None None _______ Normal Normal Normal Normal  R. Vastus lateralis Normal None None None _______ Normal Normal Normal Normal  R. Lumbar paraspinals (low) Normal None None None _______ Normal Normal Normal Normal  R. Lumbar paraspinals (mid) Normal None None None _______ Normal Normal Normal Normal  R. Cervical paraspinals Normal None None None _______ Normal Normal Normal Normal  R. First dorsal interosseous Normal None None None _______ Normal Normal Normal Normal  R. Abductor pollicis brevis Normal None None None _______ Normal Normal Normal Reduced  R. Pronator teres Normal None None None _______ Normal Normal Normal Normal  R. Biceps brachii Normal None None None _______ Normal Normal Normal Normal  R. Deltoid Normal None None None _______ Normal Normal Normal Normal  R. Triceps brachii Normal None None None _______ Normal Normal Normal Normal  R. Extensor digitorum communis Normal None None None _______ Normal Normal Normal Normal     "

## 2024-03-12 ENCOUNTER — Ambulatory Visit: Payer: Self-pay | Admitting: Neurology

## 2024-03-12 LAB — CBC WITH DIFFERENTIAL/PLATELET
Basophils Absolute: 0.1 x10E3/uL (ref 0.0–0.2)
Basos: 2 %
EOS (ABSOLUTE): 0.6 x10E3/uL — ABNORMAL HIGH (ref 0.0–0.4)
Eos: 9 %
Hematocrit: 43.8 % (ref 37.5–51.0)
Hemoglobin: 14.4 g/dL (ref 13.0–17.7)
Immature Grans (Abs): 0 x10E3/uL (ref 0.0–0.1)
Immature Granulocytes: 0 %
Lymphocytes Absolute: 1.4 x10E3/uL (ref 0.7–3.1)
Lymphs: 23 %
MCH: 29.9 pg (ref 26.6–33.0)
MCHC: 32.9 g/dL (ref 31.5–35.7)
MCV: 91 fL (ref 79–97)
Monocytes Absolute: 0.5 x10E3/uL (ref 0.1–0.9)
Monocytes: 9 %
Neutrophils Absolute: 3.4 x10E3/uL (ref 1.4–7.0)
Neutrophils: 57 %
Platelets: 232 x10E3/uL (ref 150–450)
RBC: 4.81 x10E6/uL (ref 4.14–5.80)
RDW: 13 % (ref 11.6–15.4)
WBC: 5.9 x10E3/uL (ref 3.4–10.8)

## 2024-03-12 LAB — COMPREHENSIVE METABOLIC PANEL WITH GFR
ALT: 15 IU/L (ref 0–44)
AST: 16 IU/L (ref 0–40)
Albumin: 4.6 g/dL (ref 3.9–4.9)
Alkaline Phosphatase: 56 IU/L (ref 47–123)
BUN/Creatinine Ratio: 14 (ref 10–24)
BUN: 14 mg/dL (ref 8–27)
Bilirubin Total: 0.9 mg/dL (ref 0.0–1.2)
CO2: 23 mmol/L (ref 20–29)
Calcium: 9.3 mg/dL (ref 8.6–10.2)
Chloride: 101 mmol/L (ref 96–106)
Creatinine, Ser: 0.97 mg/dL (ref 0.76–1.27)
Globulin, Total: 2.5 g/dL (ref 1.5–4.5)
Glucose: 86 mg/dL (ref 70–99)
Potassium: 4.3 mmol/L (ref 3.5–5.2)
Sodium: 140 mmol/L (ref 134–144)
Total Protein: 7.1 g/dL (ref 6.0–8.5)
eGFR: 85 mL/min/1.73

## 2024-03-12 LAB — MULTIPLE MYELOMA PANEL, SERUM
Albumin SerPl Elph-Mcnc: 4.1 g/dL (ref 2.9–4.4)
Albumin/Glob SerPl: 1.4 (ref 0.7–1.7)
Alpha 1: 0.2 g/dL (ref 0.0–0.4)
Alpha2 Glob SerPl Elph-Mcnc: 0.6 g/dL (ref 0.4–1.0)
B-Globulin SerPl Elph-Mcnc: 1.1 g/dL (ref 0.7–1.3)
Gamma Glob SerPl Elph-Mcnc: 1.2 g/dL (ref 0.4–1.8)
Globulin, Total: 3 g/dL (ref 2.2–3.9)
IgG (Immunoglobin G), Serum: 1240 mg/dL (ref 603–1613)
IgM (Immunoglobulin M), Srm: 62 mg/dL (ref 20–172)
Immunoglobulin A, (IgA) QN, Serum: 238 mg/dL (ref 61–437)

## 2024-03-12 LAB — HGB A1C W/O EAG: Hgb A1c MFr Bld: 4.8 % (ref 4.8–5.6)

## 2024-03-12 LAB — TSH: TSH: 4.39 u[IU]/mL (ref 0.450–4.500)

## 2024-03-12 LAB — SEDIMENTATION RATE: Sed Rate: 2 mm/h (ref 0–30)

## 2024-03-12 LAB — VITAMIN B12: Vitamin B-12: 1198 pg/mL (ref 232–1245)

## 2024-03-12 LAB — ANA W/REFLEX IF POSITIVE: Anti Nuclear Antibody (ANA): NEGATIVE

## 2024-03-12 LAB — C-REACTIVE PROTEIN: CRP: <1 mg/L (ref 0–10)

## 2024-03-17 ENCOUNTER — Encounter: Admitting: Neurology
